# Patient Record
Sex: Female | Born: 1955 | Race: White | Hispanic: No | Marital: Married | Smoking: Never smoker
Health system: Southern US, Community
[De-identification: ages and names within clinical notes are randomized; demographics above are authoritative.]

## PROBLEM LIST (undated history)

## (undated) DIAGNOSIS — C801 Malignant (primary) neoplasm, unspecified: Secondary | ICD-10-CM

## (undated) DIAGNOSIS — N952 Postmenopausal atrophic vaginitis: Secondary | ICD-10-CM

## (undated) DIAGNOSIS — R8290 Unspecified abnormal findings in urine: Secondary | ICD-10-CM

## (undated) DIAGNOSIS — N950 Postmenopausal bleeding: Principal | ICD-10-CM

## (undated) DIAGNOSIS — R519 Headache, unspecified: Secondary | ICD-10-CM

## (undated) DIAGNOSIS — R319 Hematuria, unspecified: Principal | ICD-10-CM

## (undated) DIAGNOSIS — M1990 Unspecified osteoarthritis, unspecified site: Secondary | ICD-10-CM

## (undated) DIAGNOSIS — R51 Headache: Secondary | ICD-10-CM

## (undated) HISTORY — DX: Hematuria, unspecified: R31.9

## (undated) HISTORY — DX: Postmenopausal atrophic vaginitis: N95.2

## (undated) HISTORY — DX: Malignant (primary) neoplasm, unspecified: C80.1

## (undated) HISTORY — PX: OTHER SURGICAL HISTORY: SHX169

## (undated) HISTORY — DX: Unspecified abnormal findings in urine: R82.90

## (undated) HISTORY — DX: Postmenopausal bleeding: N95.0

---

## 2002-10-05 ENCOUNTER — Ambulatory Visit (HOSPITAL_COMMUNITY): Admission: RE | Admit: 2002-10-05 | Discharge: 2002-10-05 | Payer: Self-pay | Admitting: Obstetrics and Gynecology

## 2002-10-05 ENCOUNTER — Encounter: Payer: Self-pay | Admitting: Obstetrics and Gynecology

## 2005-01-22 DIAGNOSIS — C4491 Basal cell carcinoma of skin, unspecified: Secondary | ICD-10-CM

## 2005-01-22 HISTORY — DX: Basal cell carcinoma of skin, unspecified: C44.91

## 2006-06-24 ENCOUNTER — Ambulatory Visit (HOSPITAL_COMMUNITY): Admission: RE | Admit: 2006-06-24 | Discharge: 2006-06-24 | Payer: Self-pay | Admitting: Obstetrics and Gynecology

## 2013-06-30 ENCOUNTER — Ambulatory Visit (INDEPENDENT_AMBULATORY_CARE_PROVIDER_SITE_OTHER): Payer: 59 | Admitting: Adult Health

## 2013-06-30 ENCOUNTER — Encounter: Payer: Self-pay | Admitting: Adult Health

## 2013-06-30 VITALS — BP 120/78 | Ht 68.0 in | Wt 148.0 lb

## 2013-06-30 DIAGNOSIS — N952 Postmenopausal atrophic vaginitis: Secondary | ICD-10-CM

## 2013-06-30 DIAGNOSIS — R82998 Other abnormal findings in urine: Secondary | ICD-10-CM

## 2013-06-30 DIAGNOSIS — R319 Hematuria, unspecified: Secondary | ICD-10-CM

## 2013-06-30 DIAGNOSIS — R8290 Unspecified abnormal findings in urine: Secondary | ICD-10-CM | POA: Insufficient documentation

## 2013-06-30 HISTORY — DX: Postmenopausal atrophic vaginitis: N95.2

## 2013-06-30 HISTORY — DX: Hematuria, unspecified: R31.9

## 2013-06-30 LAB — POCT URINALYSIS DIPSTICK
Glucose, UA: NEGATIVE
LEUKOCYTES UA: NEGATIVE
NITRITE UA: NEGATIVE
PROTEIN UA: NEGATIVE

## 2013-06-30 NOTE — Patient Instructions (Signed)
Hematuria, Adult Hematuria is blood in your urine. It can be caused by a bladder infection, kidney infection, prostate infection, kidney stone, or cancer of your urinary tract. Infections can usually be treated with medicine, and a kidney stone usually will pass through your urine. If neither of these is the cause of your hematuria, further workup to find out the reason may be needed. It is very important that you tell your health care provider about any blood you see in your urine, even if the blood stops without treatment or happens without causing pain. Blood in your urine that happens and then stops and then happens again can be a symptom of a very serious condition. Also, pain is not a symptom in the initial stages of many urinary cancers. HOME CARE INSTRUCTIONS   Drink lots of fluid, 3 4 quarts a day. If you have been diagnosed with an infection, cranberry juice is especially recommended, in addition to large amounts of water.  Avoid caffeine, tea, and carbonated beverages, because they tend to irritate the bladder.  Avoid alcohol because it may irritate the prostate.  Only take over-the-counter or prescription medicines for pain, discomfort, or fever as directed by your health care provider.  If you have been diagnosed with a kidney stone, follow your health care provider's instructions regarding straining your urine to catch the stone.  Empty your bladder often. Avoid holding urine for long periods of time.  After a bowel movement, women should cleanse front to back. Use each tissue only once.  Empty your bladder before and after sexual intercourse if you are a female. SEEK MEDICAL CARE IF: You develop back pain, fever, a feeling of sickness in your stomach (nausea), or vomiting or if your symptoms are not better in 3 days. Return sooner if you are getting worse. SEEK IMMEDIATE MEDICAL CARE IF:   You have a persistent fever, with a temperature of 101.22F (38.8C) or greater.  You  develop severe vomiting and are unable to keep the medicine down.  You develop severe back or abdominal pain despite taking your medicines.  You begin passing a large amount of blood or clots in your urine.  You feel extremely weak or faint, or you pass out. MAKE SURE YOU:   Understand these instructions.  Will watch your condition.  Will get help right away if you are not doing well or get worse. Document Released: 01/08/2005 Document Revised: 10/29/2012 Document Reviewed: 09/08/2012 [facility] Patient Information 2014 [place]. Return in 1 week for pap and physical

## 2013-06-30 NOTE — Progress Notes (Signed)
Subjective:     Patient ID: Amy Kelley, female   DOB: 1956-01-19, 58 y.o.   MRN: [id number]  HPI Amy Kelley is a 58 year old white female, married in complaining of odor in urine x 1 month, no burning or itching has some stress UI at times.Had having sex at present.Has not had pap in over 10 years.  Review of Systems See HPI Reviewed past medical,surgical, social and family history. Reviewed medications and allergies.     Objective:   Physical Exam BP 120/78  Ht 5\' 8"  (1.727 m)  Wt 148 lb (67.132 kg)  BMI 22.51 kg/m2Urine dipstick +blood, Skin warm and dry.Pelvic: external genitalia is normal in appearance for age, has angiokeratoma of fordyce, vagina: has loss of color, moisture and rugae, cervix:smooth and atrophic ? Small polyp noted, uterus: normal size, shape and contour, non tender, no masses felt, adnexa: no masses or tenderness noted.   Discussed that loss of estrogen can affect bladder and vagina, will discuss further at follow up.   Assessment:     Hematuria Bad odor of urine Vaginal atrophy    Plan:    UA C&S sent Review handout on hematuria Push fluids Return in 1 week for pap and physical

## 2013-07-08 ENCOUNTER — Telehealth: Payer: Self-pay | Admitting: Adult Health

## 2013-07-08 ENCOUNTER — Other Ambulatory Visit: Payer: 59 | Admitting: Adult Health

## 2013-07-08 NOTE — Telephone Encounter (Signed)
Left message to call back  

## 2013-07-09 ENCOUNTER — Telehealth: Payer: Self-pay | Admitting: Adult Health

## 2013-07-09 NOTE — Telephone Encounter (Signed)
Has had URI and cancelled appt, told her to get urine when comes back in

## 2013-07-23 ENCOUNTER — Encounter: Payer: Self-pay | Admitting: Adult Health

## 2013-07-23 ENCOUNTER — Ambulatory Visit (INDEPENDENT_AMBULATORY_CARE_PROVIDER_SITE_OTHER): Payer: 59 | Admitting: Adult Health

## 2013-07-23 ENCOUNTER — Other Ambulatory Visit: Payer: Self-pay | Admitting: Adult Health

## 2013-07-23 ENCOUNTER — Other Ambulatory Visit (HOSPITAL_COMMUNITY)
Admission: RE | Admit: 2013-07-23 | Discharge: 2013-07-23 | Disposition: A | Discharge status: Home / Self Care | Payer: 59 | Source: Ambulatory Visit | Attending: Adult Health | Admitting: Adult Health

## 2013-07-23 VITALS — BP 120/78 | HR 72 | Ht 67.5 in | Wt 144.5 lb

## 2013-07-23 DIAGNOSIS — Z1212 Encounter for screening for malignant neoplasm of rectum: Secondary | ICD-10-CM

## 2013-07-23 DIAGNOSIS — Z01419 Encounter for gynecological examination (general) (routine) without abnormal findings: Secondary | ICD-10-CM | POA: Insufficient documentation

## 2013-07-23 DIAGNOSIS — Z139 Encounter for screening, unspecified: Secondary | ICD-10-CM

## 2013-07-23 DIAGNOSIS — N952 Postmenopausal atrophic vaginitis: Secondary | ICD-10-CM

## 2013-07-23 DIAGNOSIS — Z87448 Personal history of other diseases of urinary system: Secondary | ICD-10-CM

## 2013-07-23 DIAGNOSIS — Z1151 Encounter for screening for human papillomavirus (HPV): Secondary | ICD-10-CM | POA: Insufficient documentation

## 2013-07-23 LAB — URINALYSIS
Bilirubin Urine: NEGATIVE
Glucose, UA: NEGATIVE mg/dL
HGB URINE DIPSTICK: NEGATIVE
KETONES UR: NEGATIVE mg/dL
Leukocytes, UA: NEGATIVE
Nitrite: NEGATIVE
Protein, ur: NEGATIVE mg/dL
SPECIFIC GRAVITY, URINE: 1.011 (ref 1.005–1.030)
UROBILINOGEN UA: 0.2 mg/dL (ref 0.0–1.0)
pH: 7 (ref 5.0–8.0)

## 2013-07-23 LAB — HEMOCCULT GUIAC POC 1CARD (OFFICE): Fecal Occult Blood, POC: NEGATIVE

## 2013-07-23 NOTE — Progress Notes (Signed)
Patient ID: Amy Kelley, female   DOB: March 15, 1955, 58 y.o.   MRN: [id number] History of Present Illness: Amy Kelley is a 58 year old white female, married in for a pap and physical.She still notices odor when pees.   Current Medications, Allergies, Past Medical History, Past Surgical History, Family History and Social History were reviewed in [employer] record.     Review of Systems: Patient denies any headaches, blurred vision, shortness of breath, chest pain, abdominal pain, problems with bowel movements, urination, or intercourse. Not currently having sex,no joint pain or mood swings.Has odor when pees, had blood in urine last visit. Reviewed old records from Dr Bynum Bellows last seen 2007.   Physical Exam:BP 120/78  Pulse 72  Ht 5' 7.5" (1.715 m)  Wt 144 lb 8 oz (65.545 kg)  BMI 22.28 kg/m2 General:  Well developed, well nourished, no acute distress Skin:  Warm and dry Neck:  Midline trachea, normal thyroid Lungs; Clear to auscultation bilaterally Breast:  No dominant palpable mass, retraction, or nipple discharge Cardiovascular: Regular rate and rhythm Abdomen:  Soft, non tender, no hepatosplenomegaly Pelvic:  External genitalia is normal in appearance.  The vagina has loss of color,moisture and rugae.    The cervix is atrophic and stenotic at os, pap with HPV performed, no odor noted.  Uterus is felt to be normal size, shape, and contour.  No        adnexal masses or tenderness noted. Rectal: Good sphincter tone, no polyps, or hemorrhoids felt.  Hemoccult negative. Extremities:  No swelling or varicosities noted Psych:  No mood changes, alert and cooperative,seems happy   Impression: Yearly gyn exam History of hematuria Vaginal atrophy    Plan: Physical in 1 year Mammogram now and yearly Refer to Dr Laural Golden for colonoscopy Get fasting labs in near future, CBC,CMP,TSH and lipids Try Luvena first, may try vaginal estrogen if luvena does not help Review  handout on menopause changes Dexa at 59 UA C&S sent

## 2013-07-23 NOTE — Patient Instructions (Signed)
Physical in 1 year Mammogram now and yearly [phone] Refer to Dr Laural Golden  For colonoscopy Try luvena  Get fasting labs in near future  Menopause Menopause is the normal time of life when menstrual periods stop completely. Menopause is complete when you have missed 12 consecutive menstrual periods. It usually occurs between the ages of 64 years and 75 years. Very rarely does a woman develop menopause before the age of 66 years. At menopause, your ovaries stop producing the female hormones estrogen and progesterone. This can cause undesirable symptoms and also affect your health. Sometimes the symptoms may occur 4-5 years before the menopause begins. There is no relationship between menopause and:  Oral contraceptives.  Number of children you had.  Race.  The age your menstrual periods started (menarche). Heavy smokers and very thin women may develop menopause earlier in life. CAUSES  The ovaries stop producing the female hormones estrogen and progesterone.  Other causes include:  Surgery to remove both ovaries.  The ovaries stop functioning for no known reason.  Tumors of the pituitary gland in the brain.  Medical disease that affects the ovaries and hormone production.  Radiation treatment to the abdomen or pelvis.  Chemotherapy that affects the ovaries. SYMPTOMS   Hot flashes.  Night sweats.  Decrease in sex drive.  Vaginal dryness and thinning of the vagina causing painful intercourse.  Dryness of the skin and developing wrinkles.  Headaches.  Tiredness.  Irritability.  Memory problems.  Weight gain.  Bladder infections.  Hair growth of the face and chest.  Infertility. More serious symptoms include:  Loss of bone (osteoporosis) causing breaks (fractures).  Depression.  Hardening and narrowing of the arteries (atherosclerosis) causing heart attacks and strokes. DIAGNOSIS   When the menstrual periods have stopped for 12 straight months.  Physical  exam.  Hormone studies of the blood. TREATMENT  There are many treatment choices and nearly as many questions about them. The decisions to treat or not to treat menopausal changes is an individual choice made with your health care provider. Your health care provider can discuss the treatments with you. Together, you can decide which treatment will work best for you. Your treatment choices may include:   Hormone therapy (estrogen and progesterone).  Non-hormonal medicines.  Treating the individual symptoms with medicine (for example antidepressants for depression).  Herbal medicines that may help specific symptoms.  Counseling by a psychiatrist or psychologist.  Group therapy.  Lifestyle changes including:  Eating healthy.  Regular exercise.  Limiting caffeine and alcohol.  Stress management and meditation.  No treatment. HOME CARE INSTRUCTIONS   Take the medicine your health care provider gives you as directed.  Get plenty of sleep and rest.  Exercise regularly.  Eat a diet that contains calcium (good for the bones) and soy products (acts like estrogen hormone).  Avoid alcoholic beverages.  Do not smoke.  If you have hot flashes, dress in layers.  Take supplements, calcium, and vitamin D to strengthen bones.  You can use over-the-counter lubricants or moisturizers for vaginal dryness.  Group therapy is sometimes very helpful.  Acupuncture may be helpful in some cases. SEEK MEDICAL CARE IF:   You are not sure you are in menopause.  You are having menopausal symptoms and need advice and treatment.  You are still having menstrual periods after age 14 years.  You have pain with intercourse.  Menopause is complete (no menstrual period for 12 months) and you develop vaginal bleeding.  You need a referral  to a specialist (gynecologist, psychiatrist, or psychologist) for treatment. SEEK IMMEDIATE MEDICAL CARE IF:   You have severe depression.  You have  excessive vaginal bleeding.  You fell and think you have a broken bone.  You have pain when you urinate.  You develop leg or chest pain.  You have a fast pounding heart beat (palpitations).  You have severe headaches.  You develop vision problems.  You feel a lump in your breast.  You have abdominal pain or severe indigestion. Document Released: 03/31/2003 Document Revised: 09/10/2012 Document Reviewed: 08/07/2012 [facility] Patient Information 2015 [place], [place]. This information is not intended to replace advice given to you by your health care provider. Make sure you discuss any questions you have with your health care provider.

## 2013-07-26 LAB — URINE CULTURE: Colony Count: 75000

## 2013-07-27 ENCOUNTER — Other Ambulatory Visit: Payer: 59

## 2013-07-27 ENCOUNTER — Telehealth: Payer: Self-pay | Admitting: Adult Health

## 2013-07-27 DIAGNOSIS — Z0000 Encounter for general adult medical examination without abnormal findings: Secondary | ICD-10-CM

## 2013-07-27 DIAGNOSIS — Z13220 Encounter for screening for lipoid disorders: Secondary | ICD-10-CM

## 2013-07-27 DIAGNOSIS — Z1329 Encounter for screening for other suspected endocrine disorder: Secondary | ICD-10-CM

## 2013-07-27 LAB — CBC
HCT: 39.7 % (ref 36.0–46.0)
HEMOGLOBIN: 13.8 g/dL (ref 12.0–15.0)
MCH: 29.2 pg (ref 26.0–34.0)
MCHC: 34.8 g/dL (ref 30.0–36.0)
MCV: 83.9 fL (ref 78.0–100.0)
Platelets: 223 10*3/uL (ref 150–400)
RBC: 4.73 MIL/uL (ref 3.87–5.11)
RDW: 13.3 % (ref 11.5–15.5)
WBC: 4.6 10*3/uL (ref 4.0–10.5)

## 2013-07-27 MED ORDER — SULFAMETHOXAZOLE-TMP DS 800-160 MG PO TABS: 1.0000 | ORAL_TABLET | Freq: Two times a day (BID) | ORAL | Status: DC

## 2013-07-27 NOTE — Telephone Encounter (Signed)
Rx septra ds 1 bid x 7 days for UTI +ecoli

## 2013-07-28 ENCOUNTER — Encounter (INDEPENDENT_AMBULATORY_CARE_PROVIDER_SITE_OTHER): Payer: Self-pay | Admitting: *Deleted

## 2013-07-28 LAB — COMPREHENSIVE METABOLIC PANEL
ALK PHOS: 77 U/L (ref 39–117)
ALT: 16 U/L (ref 0–35)
AST: 24 U/L (ref 0–37)
Albumin: 4.4 g/dL (ref 3.5–5.2)
BILIRUBIN TOTAL: 0.4 mg/dL (ref 0.2–1.2)
BUN: 11 mg/dL (ref 6–23)
CO2: 30 meq/L (ref 19–32)
Calcium: 8.9 mg/dL (ref 8.4–10.5)
Chloride: 103 mEq/L (ref 96–112)
Creat: 0.6 mg/dL (ref 0.50–1.10)
Glucose, Bld: 83 mg/dL (ref 70–99)
Potassium: 4.4 mEq/L (ref 3.5–5.3)
SODIUM: 141 meq/L (ref 135–145)
TOTAL PROTEIN: 6.6 g/dL (ref 6.0–8.3)

## 2013-07-28 LAB — LIPID PANEL
Cholesterol: 202 mg/dL — ABNORMAL HIGH (ref 0–200)
HDL: 61 mg/dL (ref >39)
LDL CALC: 125 mg/dL — AB (ref 0–99)
Total CHOL/HDL Ratio: 3.3 Ratio
Triglycerides: 82 mg/dL (ref <150)
VLDL: 16 mg/dL (ref 0–40)

## 2013-07-28 LAB — CYTOLOGY - PAP

## 2013-07-28 LAB — TSH: TSH: 1.245 u[IU]/mL (ref 0.350–4.500)

## 2013-07-29 ENCOUNTER — Telehealth: Payer: Self-pay | Admitting: Adult Health

## 2013-07-29 NOTE — Telephone Encounter (Signed)
Pt aware of labs and pap, will mail labs to her

## 2013-07-29 NOTE — Telephone Encounter (Signed)
Left message labs back and look good and pap normal, call me back

## 2013-07-30 ENCOUNTER — Ambulatory Visit (HOSPITAL_COMMUNITY)
Admission: RE | Admit: 2013-07-30 | Discharge: 2013-07-30 | Disposition: A | Discharge status: Home / Self Care | Payer: 59 | Source: Ambulatory Visit | Attending: Adult Health | Admitting: Adult Health

## 2013-07-30 DIAGNOSIS — Z139 Encounter for screening, unspecified: Secondary | ICD-10-CM

## 2013-07-30 DIAGNOSIS — Z1231 Encounter for screening mammogram for malignant neoplasm of breast: Secondary | ICD-10-CM | POA: Insufficient documentation

## 2013-07-31 ENCOUNTER — Encounter (INDEPENDENT_AMBULATORY_CARE_PROVIDER_SITE_OTHER): Payer: Self-pay | Admitting: *Deleted

## 2013-07-31 ENCOUNTER — Other Ambulatory Visit (INDEPENDENT_AMBULATORY_CARE_PROVIDER_SITE_OTHER): Payer: Self-pay | Admitting: *Deleted

## 2013-07-31 DIAGNOSIS — Z1211 Encounter for screening for malignant neoplasm of colon: Secondary | ICD-10-CM

## 2013-08-24 ENCOUNTER — Telehealth (INDEPENDENT_AMBULATORY_CARE_PROVIDER_SITE_OTHER): Payer: Self-pay | Admitting: *Deleted

## 2013-08-24 DIAGNOSIS — Z1211 Encounter for screening for malignant neoplasm of colon: Secondary | ICD-10-CM

## 2013-08-24 NOTE — Telephone Encounter (Signed)
Patient needs movi prep 

## 2013-08-26 MED ORDER — PEG-KCL-NACL-NASULF-NA ASC-C 100 G PO SOLR: 1.0000 | Freq: Once | ORAL | Status: DC

## 2013-09-18 ENCOUNTER — Telehealth (INDEPENDENT_AMBULATORY_CARE_PROVIDER_SITE_OTHER): Payer: Self-pay | Admitting: *Deleted

## 2013-09-18 NOTE — Telephone Encounter (Signed)
agree

## 2013-09-18 NOTE — Telephone Encounter (Signed)
  Procedure: tcs  Reason/Indication:  screening  Has patient had this procedure before?  no  If so, when, by whom and where?    Is there a family history of colon cancer?  no  Who?  What age when diagnosed?    Is patient diabetic?   no      Does patient have prosthetic heart valve?  no  Do you have a pacemaker?  no  Has patient ever had endocarditis? no  Has patient had joint replacement within last 12 months?  no  Does patient tend to be constipated or take laxatives? no  Is patient on Coumadin, Plavix and/or Aspirin? no  Medications: one a day vitamin, fish oil  Allergies: nkda  Medication Adjustment:   Procedure date & time: 10/15/13 at 930

## 2013-09-29 ENCOUNTER — Encounter (HOSPITAL_COMMUNITY): Payer: Self-pay | Admitting: Pharmacy Technician

## 2013-10-15 ENCOUNTER — Ambulatory Visit (HOSPITAL_COMMUNITY)
Admission: RE | Admit: 2013-10-15 | Discharge: 2013-10-15 | Disposition: A | Discharge status: Home / Self Care | Payer: 59 | Source: Ambulatory Visit | Attending: Internal Medicine | Admitting: Internal Medicine

## 2013-10-15 ENCOUNTER — Encounter (HOSPITAL_COMMUNITY)
Admission: RE | Disposition: A | Discharge status: Home / Self Care | Payer: Self-pay | Source: Ambulatory Visit | Attending: Internal Medicine

## 2013-10-15 ENCOUNTER — Encounter (HOSPITAL_COMMUNITY): Payer: Self-pay | Admitting: *Deleted

## 2013-10-15 DIAGNOSIS — Z85828 Personal history of other malignant neoplasm of skin: Secondary | ICD-10-CM | POA: Insufficient documentation

## 2013-10-15 DIAGNOSIS — Q438 Other specified congenital malformations of intestine: Secondary | ICD-10-CM | POA: Insufficient documentation

## 2013-10-15 DIAGNOSIS — Z1211 Encounter for screening for malignant neoplasm of colon: Secondary | ICD-10-CM

## 2013-10-15 DIAGNOSIS — K644 Residual hemorrhoidal skin tags: Secondary | ICD-10-CM | POA: Insufficient documentation

## 2013-10-15 HISTORY — PX: COLONOSCOPY: SHX5424

## 2013-10-15 SURGERY — COLONOSCOPY
Anesthesia: Moderate Sedation

## 2013-10-15 MED ORDER — MEPERIDINE HCL 50 MG/ML IJ SOLN
INTRAMUSCULAR | Status: AC
  Filled 2013-10-15: qty 1

## 2013-10-15 MED ORDER — STERILE WATER FOR IRRIGATION IR SOLN
Status: DC | PRN
  Administered 2013-10-15: 09:00:00

## 2013-10-15 MED ORDER — MIDAZOLAM HCL 5 MG/5ML IJ SOLN
INTRAMUSCULAR | Status: DC | PRN
  Administered 2013-10-15: 2 mg via INTRAVENOUS
  Administered 2013-10-15 (×3): 1 mg via INTRAVENOUS
  Administered 2013-10-15: 2 mg via INTRAVENOUS

## 2013-10-15 MED ORDER — MIDAZOLAM HCL 5 MG/5ML IJ SOLN
INTRAMUSCULAR | Status: AC
  Filled 2013-10-15: qty 10

## 2013-10-15 MED ORDER — SODIUM CHLORIDE 0.9 % IV SOLN
INTRAVENOUS | Status: DC
  Administered 2013-10-15: 09:00:00 via INTRAVENOUS

## 2013-10-15 MED ORDER — MEPERIDINE HCL 50 MG/ML IJ SOLN
INTRAMUSCULAR | Status: DC | PRN
  Administered 2013-10-15 (×2): 25 mg via INTRAVENOUS

## 2013-10-15 NOTE — Discharge Instructions (Signed)
Resume usual medications and diet. °No driving for 24 hours. °Next screening exam in 10 years. ° ° °Colonoscopy, Care After °Refer to this sheet in the next few weeks. These instructions provide you with information on caring for yourself after your procedure. Your health care provider may also give you more specific instructions. Your treatment has been planned according to current medical practices, but problems sometimes occur. Call your health care provider if you have any problems or questions after your procedure. °WHAT TO EXPECT AFTER THE PROCEDURE  °After your procedure, it is typical to have the following: °· A small amount of blood in your stool. °· Moderate amounts of gas and mild abdominal cramping or bloating. °HOME CARE INSTRUCTIONS °· Do not drive, operate machinery, or sign important documents for 24 hours. °· You may shower and resume your regular physical activities, but move at a slower pace for the first 24 hours. °· Take frequent rest periods for the first 24 hours. °· Walk around or put a warm pack on your abdomen to help reduce abdominal cramping and bloating. °· Drink enough fluids to keep your urine clear or pale yellow. °· You may resume your normal diet as instructed by your health care provider. Avoid heavy or fried foods that are hard to digest. °· Avoid drinking alcohol for 24 hours or as instructed by your health care provider. °· Only take over-the-counter or prescription medicines as directed by your health care provider. °· If a tissue sample (biopsy) was taken during your procedure: °¨ Do not take aspirin or blood thinners for 7 days, or as instructed by your health care provider. °¨ Do not drink alcohol for 7 days, or as instructed by your health care provider. °¨ Eat soft foods for the first 24 hours. °SEEK MEDICAL CARE IF: °You have persistent spotting of blood in your stool 2-3 days after the procedure. °SEEK IMMEDIATE MEDICAL CARE IF: °· You have more than a small spotting of  blood in your stool. °· You pass large blood clots in your stool. °· Your abdomen is swollen (distended). °· You have nausea or vomiting. °· You have a fever. °· You have increasing abdominal pain that is not relieved with medicine. °Document Released: 08/23/2003 Document Revised: 10/29/2012 Document Reviewed: 09/15/2012 °ExitCare® Patient Information ©2015 [employer]. This information is not intended to replace advice given to you by your health care provider. Make sure you discuss any questions you have with your health care provider. ° °

## 2013-10-15 NOTE — H&P (Signed)
Amy Kelley is an 58 y.o. female.   Chief Complaint: Patient is here for colonoscopy. HPI: Patient is 58 year old Caucasian female who is here for screening colonoscopy. She denies abdominal pain change in her bowel habits or frank rectal bleeding. She has intermittent hematochezia felt to be secondary to hemorrhoids. Family history is negative for CRC.  Past Medical History  Diagnosis Date  . Cancer     skin cancer   . Hematuria 06/30/2013  . Bad odor of urine 06/30/2013  . Vaginal atrophy 06/30/2013    Past Surgical History  Procedure Laterality Date  . Skin cancer removal      on left leg about 5 years ago or more.  . Cesarean section      Family History  Problem Relation Age of Onset  . Alzheimer's disease Father   . Cancer Sister     ovarian   . Epilepsy Sister   . Heart attack Maternal Grandfather   . Osteoporosis Mother    Social History:  reports that she has never smoked. She has never used smokeless tobacco. She reports that she does not drink alcohol or use illicit drugs.  Allergies: No Known Allergies  Medications Prior to Admission  Medication Sig Dispense Refill  . Multiple Vitamin (MULTIVITAMIN) tablet Take 1 tablet by mouth daily.      . Omega-3 Fatty Acids (FISH OIL PEARLS PO) Take 1 capsule by mouth daily.      . peg 3350 powder (MOVIPREP) 100 G SOLR Take 1 kit (200 g total) by mouth once.  1 kit  0    No results found for this or any previous visit (from the past 48 hour(s)). No results found.  ROS  Blood pressure 127/59, pulse 89, temperature 97.8 F (36.6 C), temperature source Oral, resp. rate 20, SpO2 98.00%. Physical Exam  Constitutional: She appears well-developed and well-nourished.  HENT:  Mouth/Throat: Oropharynx is clear and moist.  Eyes: Conjunctivae are normal. No scleral icterus.  Neck: No thyromegaly present.  GI: Soft. She exhibits no distension and no mass. There is no tenderness.  Easily palpable aorta; it is not expanded   Musculoskeletal: She exhibits no edema.  Lymphadenopathy:    She has no cervical adenopathy.  Neurological: She is alert.  Skin: Skin is warm and dry.     Assessment/Plan Average risk screening colonoscopy.  , U 10/15/2013, 9:26 AM

## 2013-10-16 ENCOUNTER — Encounter (HOSPITAL_COMMUNITY): Payer: Self-pay | Admitting: Internal Medicine

## 2013-10-28 NOTE — Op Note (Signed)
[facility] [address] [place], [address]   COLONOSCOPY PROCEDURE REPORT  PATIENT: Amy Kelley, Amy Kelley  MR#: [id number] BIRTHDATE: 01/30/1958 , 72  yrs. old GENDER: female ENDOSCOPIST: Hildred Laser, MD REFERRED BY:  Derrek Monaco PROCEDURE DATE:  10/15/2013 PROCEDURE:   Colonoscopy, screening ASA CLASS:   Class I INDICATIONS:average risk for colon cancer. MEDICATIONS: Cetacaine spray for oral pharyngeal topical anesthesia, Meperidine (Demerol) 50 mg IV, and Versed 7 mg IV  DESCRIPTION OF PROCEDURE:   After the risks and benefits and of the procedure were explained, informed consent was obtained.  revealed a normal rectal and perianal exam.    The EC-[id number] ([id number]) endoscope was introduced through the anus and advanced to the cecum, which was identified by both the appendix and ileocecal valve .  The quality of the prep was excellent. .  The instrument was then slowly withdrawn as the colon was fully examined.     COLON FINDINGS: A normal appearing cecum, ileocecal valve, and appendiceal orifice were identified.  The ascending, transverse, descending, sigmoid colon, and rectum appeared unremarkable.   The colon was redundant.  Manual abdominal counter-pressure was used to reach the cecum.     Retroflexed views revealed external hemorrhoids.     The scope was then withdrawn from the patient and the procedure completed.  WITHDRAWAL TIME: 8 minutes 33 seconds  COMPLICATIONS: There were no complications. ENDOSCOPIC IMPRESSION: 1.   external hemorrhoids otherwise normal colonoscopy 2.   The colon was redundant RECOMMENDATIONS: Standard instructions given. next screening exam in 10 years.  REPEAT EXAM:  cc:  _______________________________ Lorrin MaisHildred Laser, MD 10/15/2013 10:45 AM     PATIENT NAME:  Amy Kelley, Amy Kelley MR#: [id number]

## 2013-11-23 ENCOUNTER — Encounter (HOSPITAL_COMMUNITY): Payer: Self-pay | Admitting: Internal Medicine

## 2015-04-28 ENCOUNTER — Telehealth: Payer: Self-pay | Admitting: Adult Health

## 2015-04-28 NOTE — Telephone Encounter (Signed)
She has sister has ovarian cancer and pancreatic cancer.she has wiped some pink, will make appt for next week for exam and will then get [place] to assess

## 2015-05-02 ENCOUNTER — Encounter: Payer: Self-pay | Admitting: Adult Health

## 2015-05-02 ENCOUNTER — Ambulatory Visit (INDEPENDENT_AMBULATORY_CARE_PROVIDER_SITE_OTHER): Payer: BLUE CROSS/BLUE SHIELD | Admitting: Adult Health

## 2015-05-02 VITALS — BP 120/82 | HR 76 | Ht 68.0 in | Wt 146.0 lb

## 2015-05-02 DIAGNOSIS — N950 Postmenopausal bleeding: Secondary | ICD-10-CM | POA: Diagnosis not present

## 2015-05-02 HISTORY — DX: Postmenopausal bleeding: N95.0

## 2015-05-02 NOTE — Patient Instructions (Signed)
Postmenopausal Bleeding Postmenopausal bleeding is any bleeding a woman has after she has entered into menopause. Menopause is the end of a woman's fertile years. After menopause, a woman no longer ovulates or has menstrual periods.  Postmenopausal bleeding can be caused by various things. Any type of postmenopausal bleeding, even if it appears to be a typical menstrual period, is concerning. This should be evaluated by your health care provider. Any treatment will depend on the cause of the bleeding. HOME CARE INSTRUCTIONS Monitor your condition for any changes. The following actions may help to alleviate any discomfort you are experiencing:  Avoid the use of tampons and douches as directed by your health care provider.  Change your pads frequently.  Get regular pelvic exams and Pap tests.  Keep all follow-up appointments for diagnostic tests as directed by your health care provider. SEEK MEDICAL CARE IF:   Your bleeding lasts more than 1 week.  You have abdominal pain.  You have bleeding with sexual intercourse. SEEK IMMEDIATE MEDICAL CARE IF:   You have a fever, chills, headache, dizziness, muscle aches, and bleeding.  You have severe pain with bleeding.  You are passing blood clots.  You have bleeding and need more than 1 pad an hour.  You feel faint. MAKE SURE YOU:  Understand these instructions.  Will watch your condition.  Will get help right away if you are not doing well or get worse.   This information is not intended to replace advice given to you by your health care provider. Make sure you discuss any questions you have with your health care provider.   Document Released: 04/18/2005 Document Revised: 10/29/2012 Document Reviewed: 08/07/2012 Elsevier Interactive Patient Education [employer]. Return in 1 day For [place]

## 2015-05-02 NOTE — Progress Notes (Signed)
Subjective:     Patient ID: ALUEL BUIST, female   DOB: [birth year], 60 y.o.   MRN: [id number]  HPI Lorree is a 60 year old white female, in having had some pink blood on tissue with wiping, no pain.No other complaints, she is postmenopausal.She is not sexually active at present.  Review of Systems  +spotting when wipes in PM female, all other systems negative Reviewed past medical,surgical, social and family history. Reviewed medications and allergies.     Objective:   Physical Exam BP 120/82 mmHg  Pulse 76  Ht 5\' 8"  (1.727 m)  Wt 146 lb (66.225 kg)  BMI 22.20 kg/m2 Skin warm and dry.Pelvic: external genitalia is normal in appearance no lesions, vagina:atrophic,urethra has no lesions or masses noted, cervix:atrophic, uterus: normal size, shape and contour, non tender, no masses felt, adnexa: no masses or tenderness noted. Bladder is non tender and no masses felt.    Discussed needed [place] to assess endometrial lining to r/o polyp or increased thickness,that could be a cancer, will talk when [place] results back. Face time 15 minutes with 50% counseling.  Assessment:     PMB    Plan:     Return in 1 day for gyn [place] Review handout on PMB

## 2015-05-03 ENCOUNTER — Ambulatory Visit (INDEPENDENT_AMBULATORY_CARE_PROVIDER_SITE_OTHER): Payer: BLUE CROSS/BLUE SHIELD

## 2015-05-03 DIAGNOSIS — N888 Other specified noninflammatory disorders of cervix uteri: Secondary | ICD-10-CM

## 2015-05-03 DIAGNOSIS — N950 Postmenopausal bleeding: Secondary | ICD-10-CM

## 2015-05-03 DIAGNOSIS — N854 Malposition of uterus: Secondary | ICD-10-CM | POA: Diagnosis not present

## 2015-05-03 NOTE — Progress Notes (Signed)
PELVIC US TA/TV: homogenous anteverted uterus,EEC 2.8 mm,normal ov's bilat (mobile),no free fluid,mult small nabothian cysts

## 2015-05-05 ENCOUNTER — Telehealth: Payer: Self-pay | Admitting: Adult Health

## 2015-05-05 NOTE — Telephone Encounter (Signed)
left message that [place] was normal

## 2015-05-12 ENCOUNTER — Telehealth: Payer: Self-pay | Admitting: Family Medicine

## 2015-05-12 DIAGNOSIS — Z139 Encounter for screening, unspecified: Secondary | ICD-10-CM

## 2015-05-12 NOTE — Telephone Encounter (Signed)
Pt is requesting lab orders to be sent over for an upcoming appt. Last labs per epic were ordered by [facility]. Pt has not been seen here in a good while. Pt's husband is a current pt here.

## 2015-05-12 NOTE — Telephone Encounter (Signed)
Patient called back, I notified her that orders were in system.

## 2015-05-12 NOTE — Telephone Encounter (Signed)
[facility] 05/12/15( labs ordered)

## 2015-05-12 NOTE — Telephone Encounter (Signed)
Lip liv m7 

## 2015-05-17 LAB — HEPATIC FUNCTION PANEL
ALT: 15 IU/L (ref 0–32)
AST: 26 IU/L (ref 0–40)
Albumin: 4.4 g/dL (ref 3.6–4.8)
Alkaline Phosphatase: 106 IU/L (ref 39–117)
Bilirubin Total: 0.4 mg/dL (ref 0.0–1.2)
Bilirubin, Direct: 0.07 mg/dL (ref 0.00–0.40)
Total Protein: 7 g/dL (ref 6.0–8.5)

## 2015-05-17 LAB — BASIC METABOLIC PANEL
BUN/Creatinine Ratio: 18 (ref 12–28)
BUN: 13 mg/dL (ref 8–27)
CO2: 26 mmol/L (ref 18–29)
Calcium: 9.9 mg/dL (ref 8.7–10.3)
Chloride: 102 mmol/L (ref 96–106)
Creatinine, Ser: 0.73 mg/dL (ref 0.57–1.00)
GFR, EST AFRICAN AMERICAN: 104 mL/min/{1.73_m2} (ref >59)
GFR, EST NON AFRICAN AMERICAN: 90 mL/min/{1.73_m2} (ref >59)
Glucose: 88 mg/dL (ref 65–99)
POTASSIUM: 5.5 mmol/L — AB (ref 3.5–5.2)
SODIUM: 141 mmol/L (ref 134–144)

## 2015-05-17 LAB — LIPID PANEL
Chol/HDL Ratio: 3.3 ratio units (ref 0.0–4.4)
Cholesterol, Total: 237 mg/dL — ABNORMAL HIGH (ref 100–199)
HDL: 71 mg/dL (ref >39)
LDL Calculated: 145 mg/dL — ABNORMAL HIGH (ref 0–99)
TRIGLYCERIDES: 104 mg/dL (ref 0–149)
VLDL Cholesterol Cal: 21 mg/dL (ref 5–40)

## 2015-05-26 ENCOUNTER — Encounter: Payer: Self-pay | Admitting: Family Medicine

## 2015-05-26 ENCOUNTER — Ambulatory Visit (INDEPENDENT_AMBULATORY_CARE_PROVIDER_SITE_OTHER): Payer: BLUE CROSS/BLUE SHIELD | Admitting: Family Medicine

## 2015-05-26 ENCOUNTER — Telehealth: Payer: Self-pay | Admitting: Family Medicine

## 2015-05-26 VITALS — BP 118/82 | Ht 68.0 in | Wt 142.2 lb

## 2015-05-26 DIAGNOSIS — Z780 Asymptomatic menopausal state: Secondary | ICD-10-CM

## 2015-05-26 DIAGNOSIS — Z0000 Encounter for general adult medical examination without abnormal findings: Secondary | ICD-10-CM | POA: Diagnosis not present

## 2015-05-26 DIAGNOSIS — Z0189 Encounter for other specified special examinations: Secondary | ICD-10-CM

## 2015-05-26 DIAGNOSIS — Z13820 Encounter for screening for osteoporosis: Secondary | ICD-10-CM | POA: Diagnosis not present

## 2015-05-26 NOTE — Patient Instructions (Addendum)
Daily vitamin D plus calcium supplement, two tab daily every day 600mg  calcium plus 400 miu of vit d per tablet,  Recommend going back on centrum silver  Recommend zoster vaccine to prevent shingles    Results for orders placed or performed in visit on [id number]  Basic metabolic panel  Result Value Ref Range   Glucose 88 65 - 99 mg/dL   BUN 13 8 - 27 mg/dL   Creatinine, Ser 0.73 0.57 - 1.00 mg/dL   GFR calc non Af Amer 90 >59 mL/min/1.73   GFR calc Af Amer 104 >59 mL/min/1.73   BUN/Creatinine Ratio 18 12 - 28   Sodium 141 134 - 144 mmol/L   Potassium 5.5 (H) 3.5 - 5.2 mmol/L   Chloride 102 96 - 106 mmol/L   CO2 26 18 - 29 mmol/L   Calcium 9.9 8.7 - 10.3 mg/dL  Hepatic function panel  Result Value Ref Range   Total Protein 7.0 6.0 - 8.5 g/dL   Albumin 4.4 3.6 - 4.8 g/dL   Bilirubin Total 0.4 0.0 - 1.2 mg/dL   Bilirubin, Direct 0.07 0.00 - 0.40 mg/dL   Alkaline Phosphatase 106 39 - 117 IU/L   AST 26 0 - 40 IU/L   ALT 15 0 - 32 IU/L  Lipid panel  Result Value Ref Range   Cholesterol, Total 237 (H) 100 - 199 mg/dL   Triglycerides 104 0 - 149 mg/dL   HDL 71 >39 mg/dL   VLDL Cholesterol Cal 21 5 - 40 mg/dL   LDL Calculated 145 (H) 0 - 99 mg/dL   Chol/HDL Ratio 3.3 0.0 - 4.4 ratio units   High Cholesterol High cholesterol refers to having a high level of cholesterol in your blood. Cholesterol is a white, waxy, fat-like protein that your body needs in small amounts. Your liver makes all the cholesterol you need. Excess cholesterol comes from the food you eat. Cholesterol travels in your bloodstream through your blood vessels. If you have high cholesterol, deposits (plaque) may build up on the walls of your blood vessels. This makes the arteries narrower and stiffer. Plaque increases your risk of heart attack and stroke. Work with your health care provider to keep your cholesterol levels in a healthy range. RISK FACTORS Several things can make you more likely to have high  cholesterol. These include:   Eating foods high in animal fat (saturated fat) or cholesterol.  Being overweight.  Not getting enough exercise.  Having a family history of high cholesterol. SIGNS AND SYMPTOMS High cholesterol does not cause symptoms. DIAGNOSIS  Your health care provider can do a blood test to check whether you have high cholesterol. If you are older than 20, your health care provider may check your cholesterol every 4-6 years. You may be checked more often if you already have high cholesterol or other risk factors for heart disease. The blood test for cholesterol measures the following:  Bad cholesterol (LDL cholesterol). This is the type of cholesterol that causes heart disease. This number should be less than 100.  Good cholesterol (HDL cholesterol). This type helps protect against heart disease. A healthy level of HDL cholesterol is 60 or higher.  Total cholesterol. This is the combined number of LDL cholesterol and HDL cholesterol. A healthy number is less than 200. TREATMENT  High cholesterol can be treated with diet changes, lifestyle changes, and medicine.   Diet changes may include eating more whole grains, fruits, vegetables, nuts, and fish. You may also have to cut  back on red meat and foods with a lot of added sugar.  Lifestyle changes may include getting at least 40 minutes of aerobic exercise three times a week. Aerobic exercises include walking, biking, and swimming. Aerobic exercise along with a healthy diet can help you maintain a healthy weight. Lifestyle changes may also include quitting smoking.  If diet and lifestyle changes are not enough to lower your cholesterol, your health care provider may prescribe a statin medicine. This medicine has been shown to lower cholesterol and also lower the risk of heart disease. HOME CARE INSTRUCTIONS  Only take over-the-counter or prescription medicines as directed by your health care provider.   Follow a healthy  diet as directed by your health care provider. For instance:   Eat chicken (without skin), fish, veal, shellfish, ground [place] breast, and round or loin cuts of red meat.  Do not eat fried foods and fatty meats, such as hot dogs and salami.   Eat plenty of fruits, such as apples.   Eat plenty of vegetables, such as broccoli, potatoes, and carrots.   Eat beans, peas, and lentils.   Eat grains, such as barley, rice, couscous, and bulgur wheat.   Eat pasta without cream sauces.   Use skim or nonfat milk and low-fat or nonfat yogurt and cheeses. Do not eat or drink whole milk, cream, ice cream, egg yolks, and hard cheeses.   Do not eat stick margarine or tub margarines that contain trans fats (also called partially hydrogenated oils).   Do not eat cakes, cookies, crackers, or other baked goods that contain trans fats.   Do not eat saturated tropical oils, such as coconut and palm oil.   Exercise as directed by your health care provider. Increase your activity level with activities such as gardening or walking.   Keep all follow-up appointments.  SEEK MEDICAL CARE IF:  You are struggling to maintain a healthy diet or weight.  You need help starting an exercise program.  You need help to stop smoking. SEEK IMMEDIATE MEDICAL CARE IF:  You have chest pain.  You have trouble breathing.   This information is not intended to replace advice given to you by your health care provider. Make sure you discuss any questions you have with your health care provider.   Document Released: 01/08/2005 Document Revised: 01/29/2014 Document Reviewed: 10/31/2012 Elsevier Interactive Patient Education [employer].

## 2015-05-26 NOTE — Telephone Encounter (Signed)
Spoke with patient and informed her that Dr.Steve Luking recommends [facility] in [place]. Informed patient of telephone number and address. Patient verbalized understanding.

## 2015-05-26 NOTE — Telephone Encounter (Signed)
Pt called, states she was supposed to get a list of dermatology offices in [place] but didn't Also states that Dr. Richardson Landry highly recommended on office but patient wasn't given a name of the office (I tried to name some offices, but pt wants the office that Dr. Richardson Landry mentioned specifically)   Please advise

## 2015-05-26 NOTE — Progress Notes (Signed)
Subjective:    Patient ID: Amy Kelley, female    DOB: 09-13-1955, 60 y.o.   MRN: [id number]  HPI The patient comes in today for a wellness visit.  utd on colonoscopy, just done two yrs ago, given a ten yr pass  Oldest sister had pancreatic ca dxed in sept with spread to the liver  She is now on chemo, and on a trial drug Pt not exercising at all until just joined [employer] [facility]  A review of their health history was completed.  A review of medications was also completed.  Any needed refills; none  Eating habits: eats pretty good  Falls/  MVA accidents in past few months: none  Regular exercise: outside inside work-gardening  Specialist pt sees on regular basis: see GYN for female concerns  Preventative health issues were discussed.   Additional concerns: what supplements or vitamins are good/safe to take. Pt was on centru miultivit      Results for orders placed or performed in visit on [id number]  Basic metabolic panel  Result Value Ref Range   Glucose 88 65 - 99 mg/dL   BUN 13 8 - 27 mg/dL   Creatinine, Ser 0.73 0.57 - 1.00 mg/dL   GFR calc non Af Amer 90 >59 mL/min/1.73   GFR calc Af Amer 104 >59 mL/min/1.73   BUN/Creatinine Ratio 18 12 - 28   Sodium 141 134 - 144 mmol/L   Potassium 5.5 (H) 3.5 - 5.2 mmol/L   Chloride 102 96 - 106 mmol/L   CO2 26 18 - 29 mmol/L   Calcium 9.9 8.7 - 10.3 mg/dL  Hepatic function panel  Result Value Ref Range   Total Protein 7.0 6.0 - 8.5 g/dL   Albumin 4.4 3.6 - 4.8 g/dL   Bilirubin Total 0.4 0.0 - 1.2 mg/dL   Bilirubin, Direct 0.07 0.00 - 0.40 mg/dL   Alkaline Phosphatase 106 39 - 117 IU/L   AST 26 0 - 40 IU/L   ALT 15 0 - 32 IU/L  Lipid panel  Result Value Ref Range   Cholesterol, Total 237 (H) 100 - 199 mg/dL   Triglycerides 104 0 - 149 mg/dL   HDL 71 >39 mg/dL   VLDL Cholesterol Cal 21 5 - 40 mg/dL   LDL Calculated 145 (H) 0 - 99 mg/dL   Chol/HDL Ratio 3.3 0.0 -  4.4 ratio units    Review of Systems  Constitutional: Negative for activity change, appetite change and fatigue.  HENT: Negative for congestion, ear discharge and rhinorrhea.   Eyes: Negative for discharge.  Respiratory: Negative for cough, chest tightness and wheezing.   Cardiovascular: Negative for chest pain.  Gastrointestinal: Negative for vomiting and abdominal pain.  Genitourinary: Negative for frequency and difficulty urinating.  Musculoskeletal: Negative for neck pain.  Allergic/Immunologic: Negative for environmental allergies and food allergies.  Neurological: Negative for weakness and headaches.  Psychiatric/Behavioral: Negative for behavioral problems and agitation.  All other systems reviewed and are negative.      Objective:   Physical Exam  Constitutional: She is oriented to person, place, and time. She appears well-developed and well-nourished.  HENT:  Head: Normocephalic.  Right Ear: External ear normal.  Left Ear: External ear normal.  Eyes: Pupils are equal, round, and reactive to light.  Neck: Normal range of motion. No thyromegaly present.  Cardiovascular: Normal rate, regular rhythm, normal heart sounds and intact distal pulses.   No murmur heard. Pulmonary/Chest:  Effort normal and breath sounds normal. No respiratory distress. She has no wheezes.  Abdominal: Soft. Bowel sounds are normal. She exhibits no distension and no mass. There is no tenderness.  Musculoskeletal: Normal range of motion. She exhibits no edema or tenderness.  Lymphadenopathy:    She has no cervical adenopathy.  Neurological: She is alert and oriented to person, place, and time. She exhibits normal muscle tone.  Skin: Skin is warm and dry.  Psychiatric: She has a normal mood and affect. Her behavior is normal.  Vitals reviewed.         Assessment & Plan:  Impression wellness exam. Multiple general concerns discussed at great length plan with family history of osteoporosis we'll  do a DEXA scan. This summer it will have been 2 years since mammogram strongly encourage patient go ahead and schedule. Up-to-date on colonoscopy. Shingles vaccine recommended prescription given vitamin D plus calcium 2 tablets daily. Yearly flu shots encourage. Diet exercise discussed at length WSL

## 2015-05-26 NOTE — Telephone Encounter (Signed)
[facility], geve number, may need to ask brendale

## 2015-06-02 ENCOUNTER — Ambulatory Visit (HOSPITAL_COMMUNITY)
Admission: RE | Admit: 2015-06-02 | Discharge: 2015-06-02 | Disposition: A | Discharge status: Home / Self Care | Payer: BLUE CROSS/BLUE SHIELD | Source: Ambulatory Visit | Attending: Family Medicine | Admitting: Family Medicine

## 2015-06-02 DIAGNOSIS — Z780 Asymptomatic menopausal state: Secondary | ICD-10-CM | POA: Diagnosis present

## 2015-06-02 DIAGNOSIS — Z13820 Encounter for screening for osteoporosis: Secondary | ICD-10-CM | POA: Diagnosis present

## 2015-06-02 DIAGNOSIS — M8589 Other specified disorders of bone density and structure, multiple sites: Secondary | ICD-10-CM | POA: Insufficient documentation

## 2015-07-04 ENCOUNTER — Telehealth: Payer: Self-pay | Admitting: Family Medicine

## 2015-07-04 NOTE — Telephone Encounter (Signed)
Patient called in reference to a bill that she received for her bone density scan she had completed on 06/02/2015.  Her insurance did not cover this at 100%, but she spoke with [facility] and they advised her if the Z13.820 diagnosis was primary, then it would be covered.  She is requesting that the code be changed and someone call her and confirm that it was changed.  I have no access to the orders or putting diagnosis codes on orders, so I assume this will have to be changed by clinical staff.  Please advise.

## 2015-07-05 NOTE — Telephone Encounter (Signed)
i talked to rosie said this has to be changed on the billing side and she is working on this issue.

## 2015-07-07 NOTE — Telephone Encounter (Signed)
Patient called to check on this.  Can you please call her and give her update on the status of this today or tomorrow?

## 2015-07-07 NOTE — Telephone Encounter (Signed)
I have reached out to Amy in billing. I am awaiting a response. I would rather wait for a complete re

## 2015-07-12 NOTE — Telephone Encounter (Signed)
Patient called to check on status of this, explained that we are aware and that calls have been placed to the billing department to see what if anything can be done to get this fixed Patient states that insurance told her that "screening for osteoporosis" needs to be listed on claim as primary diagnosis & test will be covered 100% Patient states she received a bill from [facility] for this Explained that we will let her know once we find out how to fix this (if it's possible to be fixed) Please notify pt when all this is done

## 2015-07-21 ENCOUNTER — Other Ambulatory Visit: Payer: Self-pay | Admitting: Family Medicine

## 2015-07-21 DIAGNOSIS — Z1231 Encounter for screening mammogram for malignant neoplasm of breast: Secondary | ICD-10-CM

## 2015-07-28 ENCOUNTER — Encounter: Payer: Self-pay | Admitting: Family Medicine

## 2015-08-01 ENCOUNTER — Ambulatory Visit (HOSPITAL_COMMUNITY)
Admission: RE | Admit: 2015-08-01 | Discharge: 2015-08-01 | Disposition: A | Discharge status: Home / Self Care | Payer: BLUE CROSS/BLUE SHIELD | Source: Ambulatory Visit | Attending: Family Medicine | Admitting: Family Medicine

## 2015-08-01 DIAGNOSIS — Z1231 Encounter for screening mammogram for malignant neoplasm of breast: Secondary | ICD-10-CM | POA: Diagnosis not present

## 2015-08-26 NOTE — Telephone Encounter (Signed)
Patient called to check on this.  She is hoping for a call back today [phone].

## 2016-03-28 DIAGNOSIS — M79604 Pain in right leg: Secondary | ICD-10-CM | POA: Diagnosis not present

## 2016-03-28 DIAGNOSIS — R600 Localized edema: Secondary | ICD-10-CM | POA: Diagnosis not present

## 2016-03-28 DIAGNOSIS — M79605 Pain in left leg: Secondary | ICD-10-CM | POA: Diagnosis not present

## 2016-04-11 DIAGNOSIS — I8312 Varicose veins of left lower extremity with inflammation: Secondary | ICD-10-CM | POA: Diagnosis not present

## 2016-04-11 DIAGNOSIS — I8311 Varicose veins of right lower extremity with inflammation: Secondary | ICD-10-CM | POA: Diagnosis not present

## 2016-05-25 ENCOUNTER — Ambulatory Visit: Payer: Self-pay | Admitting: Ophthalmology

## 2016-05-25 ENCOUNTER — Encounter (HOSPITAL_COMMUNITY): Payer: Self-pay | Admitting: *Deleted

## 2016-05-25 DIAGNOSIS — H33011 Retinal detachment with single break, right eye: Secondary | ICD-10-CM | POA: Diagnosis not present

## 2016-05-25 DIAGNOSIS — H4311 Vitreous hemorrhage, right eye: Secondary | ICD-10-CM | POA: Diagnosis not present

## 2016-05-25 DIAGNOSIS — H5213 Myopia, bilateral: Secondary | ICD-10-CM | POA: Diagnosis not present

## 2016-05-25 DIAGNOSIS — H35362 Drusen (degenerative) of macula, left eye: Secondary | ICD-10-CM | POA: Diagnosis not present

## 2016-05-25 NOTE — Progress Notes (Signed)
Pre-op instructions given to pt only. Please complete assessments on DOS. Pt denies SOB, chest pain, and being under the care of a cardiologist. Pt denies having a stress test, echo and cardiac cath. Pt denies having an EKG and chest x ray within the last year. Pt denies having recent labs. Pt made aware to stop taking Aspirin, vitamins, fish oil and herbal medications. Do not take any NSAIDs ie: Ibuprofen, Advil, Naproxen, BC and Goody Powder or any medication containing Aspirin. Pt verbalized understanding of all pre-op instructions.

## 2016-05-25 NOTE — H&P (Signed)
  Date of examination:  05/25/16  Indication for surgery: Vitreous hemorrhage with associated reitinal detachment  Pertinent past medical history:  Past Medical History:  Diagnosis Date  . Bad odor of urine 06/30/2013  . Cancer ([place])    skin cancer   . Hematuria 06/30/2013  . PMB (postmenopausal bleeding) 05/02/2015  . Vaginal atrophy 06/30/2013    Pertinent ocular history:  Acute vision loss in the past 24 hours.  Pertinent family history:  Family History  Problem Relation Age of Onset  . Alzheimer's disease Father   . Cancer Sister     ovarian, pancreatic  . Epilepsy Sister   . Heart attack Maternal Grandfather   . Osteoporosis Mother     General:  Healthy appearing patient in no distress.   Eyes:    Acuity OD CF at 71ft.  OS 20/25  Cc  External: Within normal limits    Anterior segment: Within normal limits     Motility:   Full OU  Fundus: No view due to vitreous hemorrhageNormal OS          Impression:Vitreous hemorrhage with retinal detachment.  Plan: Pars plana vtirecotmy with laser, air/fluid exchange and gas  Royston Cowper

## 2016-05-26 ENCOUNTER — Ambulatory Visit (HOSPITAL_COMMUNITY): Payer: BLUE CROSS/BLUE SHIELD | Admitting: Anesthesiology

## 2016-05-26 ENCOUNTER — Encounter (HOSPITAL_COMMUNITY): Payer: Self-pay | Admitting: *Deleted

## 2016-05-26 ENCOUNTER — Encounter (HOSPITAL_COMMUNITY)
Admission: RE | Disposition: A | Discharge status: Home / Self Care | Payer: Self-pay | Source: Ambulatory Visit | Attending: Ophthalmology

## 2016-05-26 ENCOUNTER — Ambulatory Visit (HOSPITAL_COMMUNITY)
Admission: RE | Admit: 2016-05-26 | Discharge: 2016-05-26 | Disposition: A | Discharge status: Home / Self Care | Payer: BLUE CROSS/BLUE SHIELD | Source: Ambulatory Visit | Attending: Ophthalmology | Admitting: Ophthalmology

## 2016-05-26 DIAGNOSIS — H3321 Serous retinal detachment, right eye: Secondary | ICD-10-CM | POA: Insufficient documentation

## 2016-05-26 DIAGNOSIS — H4311 Vitreous hemorrhage, right eye: Secondary | ICD-10-CM | POA: Diagnosis not present

## 2016-05-26 DIAGNOSIS — M1990 Unspecified osteoarthritis, unspecified site: Secondary | ICD-10-CM | POA: Diagnosis not present

## 2016-05-26 DIAGNOSIS — H33011 Retinal detachment with single break, right eye: Secondary | ICD-10-CM | POA: Diagnosis not present

## 2016-05-26 HISTORY — PX: PARS PLANA VITRECTOMY: SHX2166

## 2016-05-26 HISTORY — DX: Headache, unspecified: R51.9

## 2016-05-26 HISTORY — DX: Unspecified osteoarthritis, unspecified site: M19.90

## 2016-05-26 HISTORY — DX: Headache: R51

## 2016-05-26 LAB — CBC
HCT: 42.4 % (ref 36.0–46.0)
HEMOGLOBIN: 13.9 g/dL (ref 12.0–15.0)
MCH: 29.1 pg (ref 26.0–34.0)
MCHC: 32.8 g/dL (ref 30.0–36.0)
MCV: 88.7 fL (ref 78.0–100.0)
Platelets: 243 10*3/uL (ref 150–400)
RBC: 4.78 MIL/uL (ref 3.87–5.11)
RDW: 12.6 % (ref 11.5–15.5)
WBC: 5.4 10*3/uL (ref 4.0–10.5)

## 2016-05-26 SURGERY — PARS PLANA VITRECTOMY WITH 25 GAUGE
Anesthesia: Monitor Anesthesia Care | Site: Eye | Laterality: Right

## 2016-05-26 MED ORDER — MIDAZOLAM HCL 2 MG/2ML IJ SOLN
INTRAMUSCULAR | Status: AC
  Filled 2016-05-26: qty 2

## 2016-05-26 MED ORDER — DEXAMETHASONE SODIUM PHOSPHATE 10 MG/ML IJ SOLN
INTRAMUSCULAR | Status: DC | PRN
  Administered 2016-05-26: .5 mL via INTRAVENOUS

## 2016-05-26 MED ORDER — PROMETHAZINE HCL 25 MG/ML IJ SOLN
6.2500 mg | INTRAMUSCULAR | Status: DC | PRN
Start: 2016-05-26 — End: 2016-05-26

## 2016-05-26 MED ORDER — PROPOFOL 10 MG/ML IV BOLUS
INTRAVENOUS | Status: AC
  Filled 2016-05-26: qty 20

## 2016-05-26 MED ORDER — TETRACAINE HCL 0.5 % OP SOLN
OPHTHALMIC | Status: DC | PRN
  Administered 2016-05-26: 2 [drp] via OPHTHALMIC

## 2016-05-26 MED ORDER — TOBRAMYCIN-DEXAMETHASONE 0.3-0.1 % OP OINT
TOPICAL_OINTMENT | OPHTHALMIC | Status: DC | PRN
  Administered 2016-05-26: 1 via OPHTHALMIC

## 2016-05-26 MED ORDER — GLYCOPYRROLATE 0.2 MG/ML IJ SOLN
INTRAMUSCULAR | Status: DC | PRN
  Administered 2016-05-26: 0.2 mg via INTRAVENOUS

## 2016-05-26 MED ORDER — ARTIFICIAL TEARS OPHTHALMIC OINT
TOPICAL_OINTMENT | OPHTHALMIC | Status: AC
  Filled 2016-05-26: qty 3.5

## 2016-05-26 MED ORDER — FENTANYL CITRATE (PF) 100 MCG/2ML IJ SOLN: 25.0000 ug | INTRAMUSCULAR | Status: DC | PRN

## 2016-05-26 MED ORDER — PROPOFOL 10 MG/ML IV BOLUS
INTRAVENOUS | Status: DC | PRN
  Administered 2016-05-26 (×2): 20 mg via INTRAVENOUS
  Administered 2016-05-26: 50 mg via INTRAVENOUS
  Administered 2016-05-26 (×2): 10 mg via INTRAVENOUS
  Administered 2016-05-26: 40 mg via INTRAVENOUS
  Administered 2016-05-26: 20 mg via INTRAVENOUS
  Administered 2016-05-26: 30 mg via INTRAVENOUS
  Administered 2016-05-26 (×2): 20 mg via INTRAVENOUS

## 2016-05-26 MED ORDER — BSS IO SOLN
INTRAOCULAR | Status: DC | PRN
  Administered 2016-05-26: 15 mL via INTRAOCULAR

## 2016-05-26 MED ORDER — EPINEPHRINE PF 1 MG/ML IJ SOLN
INTRAMUSCULAR | Status: AC
  Filled 2016-05-26: qty 1

## 2016-05-26 MED ORDER — PHENYLEPHRINE 40 MCG/ML (10ML) SYRINGE FOR IV PUSH (FOR BLOOD PRESSURE SUPPORT)
PREFILLED_SYRINGE | INTRAVENOUS | Status: AC
  Filled 2016-05-26: qty 10

## 2016-05-26 MED ORDER — FENTANYL CITRATE (PF) 250 MCG/5ML IJ SOLN
INTRAMUSCULAR | Status: AC
  Filled 2016-05-26: qty 5

## 2016-05-26 MED ORDER — HYPROMELLOSE (GONIOSCOPIC) 2.5 % OP SOLN
OPHTHALMIC | Status: DC | PRN
  Administered 2016-05-26: 2 [drp] via OPHTHALMIC

## 2016-05-26 MED ORDER — BSS IO SOLN
INTRAOCULAR | Status: AC
  Filled 2016-05-26: qty 15

## 2016-05-26 MED ORDER — EPINEPHRINE PF 1 MG/ML IJ SOLN
INTRAOCULAR | Status: DC | PRN
  Administered 2016-05-26: .3 mL

## 2016-05-26 MED ORDER — TETRACAINE HCL 0.5 % OP SOLN
OPHTHALMIC | Status: AC
  Filled 2016-05-26: qty 2

## 2016-05-26 MED ORDER — MIDAZOLAM HCL 5 MG/5ML IJ SOLN
INTRAMUSCULAR | Status: DC | PRN
  Administered 2016-05-26: 2 mg via INTRAVENOUS

## 2016-05-26 MED ORDER — HYPROMELLOSE (GONIOSCOPIC) 2.5 % OP SOLN
OPHTHALMIC | Status: AC
  Filled 2016-05-26: qty 15

## 2016-05-26 MED ORDER — SODIUM CHLORIDE 0.9 % IJ SOLN
INTRAMUSCULAR | Status: AC
  Filled 2016-05-26: qty 10

## 2016-05-26 MED ORDER — BUPIVACAINE HCL (PF) 0.75 % IJ SOLN
INTRAMUSCULAR | Status: AC
  Filled 2016-05-26: qty 10

## 2016-05-26 MED ORDER — TOBRAMYCIN-DEXAMETHASONE 0.3-0.1 % OP OINT
TOPICAL_OINTMENT | OPHTHALMIC | Status: AC
  Filled 2016-05-26: qty 3.5

## 2016-05-26 MED ORDER — ONDANSETRON HCL 4 MG/2ML IJ SOLN
INTRAMUSCULAR | Status: DC | PRN
  Administered 2016-05-26: 4 mg via INTRAVENOUS

## 2016-05-26 MED ORDER — ONDANSETRON HCL 4 MG/2ML IJ SOLN
INTRAMUSCULAR | Status: AC
  Filled 2016-05-26: qty 2

## 2016-05-26 MED ORDER — TROPICAMIDE 1 % OP SOLN
1.0000 [drp] | OPHTHALMIC | Status: AC | PRN
  Administered 2016-05-26 (×3): 1 [drp] via OPHTHALMIC
  Filled 2016-05-26: qty 15

## 2016-05-26 MED ORDER — OFLOXACIN 0.3 % OP SOLN
1.0000 [drp] | OPHTHALMIC | Status: AC | PRN
  Administered 2016-05-26 (×3): 1 [drp] via OPHTHALMIC
  Filled 2016-05-26: qty 5

## 2016-05-26 MED ORDER — FENTANYL CITRATE (PF) 100 MCG/2ML IJ SOLN
INTRAMUSCULAR | Status: DC | PRN
  Administered 2016-05-26 (×3): 25 ug via INTRAVENOUS

## 2016-05-26 MED ORDER — LIDOCAINE HCL 2 % IJ SOLN
INTRAMUSCULAR | Status: AC
Start: 2016-05-26 — End: 2016-05-26
  Filled 2016-05-26: qty 20

## 2016-05-26 MED ORDER — ATROPINE SULFATE 1 % OP SOLN
OPHTHALMIC | Status: AC
  Filled 2016-05-26: qty 5

## 2016-05-26 MED ORDER — BUPIVACAINE IN DEXTROSE 0.75-8.25 % IT SOLN
INTRATHECAL | Status: AC
  Filled 2016-05-26: qty 2

## 2016-05-26 MED ORDER — BSS PLUS IO SOLN
INTRAOCULAR | Status: DC | PRN
  Administered 2016-05-26: 1 via INTRAOCULAR

## 2016-05-26 MED ORDER — SODIUM CHLORIDE 0.9 % IV SOLN
INTRAVENOUS | Status: DC
  Administered 2016-05-26 (×3): via INTRAVENOUS

## 2016-05-26 MED ORDER — DEXAMETHASONE SODIUM PHOSPHATE 10 MG/ML IJ SOLN
INTRAMUSCULAR | Status: AC
  Filled 2016-05-26: qty 1

## 2016-05-26 MED ORDER — LIDOCAINE HCL 2 % IJ SOLN
INTRAMUSCULAR | Status: DC | PRN
  Administered 2016-05-26: 10 mL via RETROBULBAR

## 2016-05-26 MED ORDER — BSS PLUS IO SOLN
INTRAOCULAR | Status: AC
  Filled 2016-05-26: qty 500

## 2016-05-26 SURGICAL SUPPLY — 53 items
APPLICATOR COTTON TIP 6IN STRL (MISCELLANEOUS) ×3 IMPLANT
BLADE MVR KNIFE 20G (BLADE) IMPLANT
CANNULA ANT CHAM MAIN (OPHTHALMIC RELATED) IMPLANT
CANNULA DUAL BORE 23G (CANNULA) IMPLANT
CANNULA DUALBORE 25G (CANNULA) IMPLANT
CANNULA VLV SOFT TIP 25GA (OPHTHALMIC) ×6 IMPLANT
CAUTERY EYE LOW TEMP 1300F FIN (OPHTHALMIC RELATED) IMPLANT
CLOSURE STERI-STRIP 1/2X4 (GAUZE/BANDAGES/DRESSINGS) ×1
CLSR STERI-STRIP ANTIMIC 1/2X4 (GAUZE/BANDAGES/DRESSINGS) ×2 IMPLANT
CORDS BIPOLAR (ELECTRODE) IMPLANT
COVER MAYO STAND STRL (DRAPES) ×3 IMPLANT
DRAPE HALF SHEET 40X57 (DRAPES) ×3 IMPLANT
DRAPE INCISE 51X51 W/FILM STRL (DRAPES) ×3 IMPLANT
DRAPE RETRACTOR (MISCELLANEOUS) ×3 IMPLANT
ERASER HMR WETFIELD 23G BP (MISCELLANEOUS) IMPLANT
FORCEPS ECKARDT ILM 25G SERR (OPHTHALMIC RELATED) IMPLANT
FORCEPS GRIESHABER ILM 25G A (INSTRUMENTS) IMPLANT
GAS AUTO FILL CONSTEL (OPHTHALMIC)
GAS AUTO FILL CONSTELLATION (OPHTHALMIC) IMPLANT
GLOVE BIOGEL PI IND STRL 7.5 (GLOVE) ×1 IMPLANT
GLOVE BIOGEL PI INDICATOR 7.5 (GLOVE) ×2
GOWN STRL REUS W/ TWL LRG LVL3 (GOWN DISPOSABLE) ×1 IMPLANT
GOWN STRL REUS W/TWL LRG LVL3 (GOWN DISPOSABLE) ×2
HANDLE PNEUMATIC FOR CONSTEL (OPHTHALMIC) IMPLANT
KIT BASIN OR (CUSTOM PROCEDURE TRAY) ×3 IMPLANT
KIT ROOM TURNOVER OR (KITS) ×3 IMPLANT
LENS BIOM SUPER VIEW SET DISP (OPHTHALMIC RELATED) ×3 IMPLANT
MICROPICK 25G (MISCELLANEOUS)
NEEDLE 18GX1X1/2 (RX/OR ONLY) (NEEDLE) ×3 IMPLANT
NEEDLE 25GX 5/8IN NON SAFETY (NEEDLE) ×3 IMPLANT
NEEDLE FILTER BLUNT 18X 1/2SAF (NEEDLE) ×2
NEEDLE FILTER BLUNT 18X1 1/2 (NEEDLE) ×1 IMPLANT
NEEDLE HYPO 25GX1X1/2 BEV (NEEDLE) IMPLANT
NEEDLE HYPO 30X.5 LL (NEEDLE) ×6 IMPLANT
NEEDLE RETROBULBAR 25GX1.5 (NEEDLE) ×3 IMPLANT
NS IRRIG 1000ML POUR BTL (IV SOLUTION) ×3 IMPLANT
PACK VITRECTOMY CUSTOM (CUSTOM PROCEDURE TRAY) ×3 IMPLANT
PAD ARMBOARD 7.5X6 YLW CONV (MISCELLANEOUS) ×6 IMPLANT
PAK PIK VITRECTOMY CVS 25GA (OPHTHALMIC) ×3 IMPLANT
PENCIL BIPOLAR 25GA STR DISP (OPHTHALMIC RELATED) IMPLANT
PICK MICROPICK 25G (MISCELLANEOUS) IMPLANT
PROBE LASER ILLUM FLEX CVD 25G (OPHTHALMIC) IMPLANT
ROLLS DENTAL (MISCELLANEOUS) IMPLANT
SCRAPER DIAMOND 25GA (OPHTHALMIC RELATED) IMPLANT
STOPCOCK 4 WAY LG BORE MALE ST (IV SETS) IMPLANT
SUT VICRYL 7 0 TG140 8 (SUTURE) ×3 IMPLANT
SYR 10ML LL (SYRINGE) IMPLANT
SYR 20CC LL (SYRINGE) ×3 IMPLANT
SYR 5ML LL (SYRINGE) ×6 IMPLANT
SYR TB 1ML LUER SLIP (SYRINGE) IMPLANT
TOWEL OR 17X24 6PK STRL BLUE (TOWEL DISPOSABLE) ×6 IMPLANT
WATER STERILE IRR 1000ML POUR (IV SOLUTION) ×3 IMPLANT
WIPE INSTRUMENT VISIWIPE 73X73 (MISCELLANEOUS) IMPLANT

## 2016-05-26 NOTE — Op Note (Signed)
Amy Kelley 05/26/2016 Diagnosis: Retinal detachment right eye with vitreous hemorrhage  Procedure: Retinal detachment repair right eye with Pars Plana Vitrectomy, Endolaser, Fluid Gas Exchange and placement of SF6 gas Operative Eye:  right eye  Surgeon: Royston Cowper Estimated Blood Loss: minimal Specimens for Pathology:  None Complications: none   The  patient was prepped and draped in the usual fashion for ocular surgery on the  right eye .  A lid speculum was placed.  Infusion line and trocar was placed at the 8 o'clock position approximately 3.5 mm from the surgical limbus.   The infusion line was allowed to run and then clamped when placed at the cannula opening. The line was inserted and secured to the drape with an adhesive strip.   Active trocars/cannula were placed at the 10 and 2 o'clock positions approximately 3.5 mm from the surgical limbus. The cannula was visualized in the vitreous cavity.  The light pipe and vitreous cutter were inserted into the vitreous cavity and a core vitrectomy was performed.  Care taken to remove the vitreous up to the vitreous base for 360 degrees.  Due to the degree of hemorrhage, additional washout of the posterior chamber was performed.   A localized retinal detachment was noted at 2:00 with associated bleeding bridging vessel.   The vitreous was carefully shaved over the break and detachment with the aid of scleral depression.   3 rows of endolaser were applied 360 degrees to the periphery with the aid of scleral depression  A complete air-fluid exchange was performed.  16% SF6 gas was injected into the eye.  The superior cannulas were sequentially removed with concommitant tamponade using a cotton tipped applicator and noted to be air tight.  The infusion line and trocar were removed and the sclerotomy was noted to be air tight with normal intraocular pressure by digital palpapation.  Subconjunctival injections of  Dexamethasone 4mg /79ml was  placed in the infero-medial quadrant.   The speculum and drapes were removed and the eye was patched with Polymixin/Bacitracin ophthalmic ointment. An eye shield was placed and the patient was transferred alert and conversant with stable vital signs to the post operative recovery area.  The patient tolerated the procedure well and no complications were noted.  Royston Cowper MD

## 2016-05-26 NOTE — Brief Op Note (Signed)
05/26/2016  9:03 AM  PATIENT:  Amy Kelley  61 y.o. female  PRE-OPERATIVE DIAGNOSIS:  vitreous hemorrage with retinal detachment right eye  POST-OPERATIVE DIAGNOSIS:  vitreous hemorrage with retinal detachment right eye  PROCEDURE:  Procedure(s): Retinal Detachment repair with PARS PLANA VITRECTOMY WITH ENDO LASER, SF6 INJECTION (Right)  SURGEON:  Surgeon(s) and Role:    Jalene Mullet, MD - Primary  PHYSICIAN ASSISTANT:   ASSISTANTS: none   ANESTHESIA:   local and MAC  EBL:  Total I/O In: 500 [I.V.:500] Out: -   BLOOD ADMINISTERED:none  DRAINS: none   LOCAL MEDICATIONS USED:  MARCAINE    and LIDOCAINE   SPECIMEN:  No Specimen  DISPOSITION OF SPECIMEN:  N/A  COUNTS:  YES  TOURNIQUET:  * No tourniquets in log *  DICTATION: .Note written in EPIC  PLAN OF CARE: Discharge to home after PACU  PATIENT DISPOSITION:  PACU - hemodynamically stable.   Delay start of Pharmacological VTE agent (>24hrs) due to surgical blood loss or risk of bleeding: not applicable

## 2016-05-26 NOTE — Anesthesia Preprocedure Evaluation (Addendum)
Anesthesia Evaluation    Airway Mallampati: II  TM Distance: >3 FB Neck ROM: Full    Dental  (+) Teeth Intact, Dental Advisory Given   Pulmonary    Pulmonary exam normal breath sounds clear to auscultation       Cardiovascular Exercise Tolerance: Good Normal cardiovascular exam Rhythm:Regular Rate:Normal     Neuro/Psych  Headaches, negative psych ROS   GI/Hepatic   Endo/Other    Renal/GU      Musculoskeletal  (+) Arthritis , Osteoarthritis,    Abdominal   Peds  Hematology   Anesthesia Other Findings   Reproductive/Obstetrics                             Anesthesia Physical Anesthesia Plan  ASA: II  Anesthesia Plan: MAC   Post-op Pain Management:    Induction: Intravenous  Airway Management Planned: Nasal Cannula  Additional Equipment:   Intra-op Plan:   Post-operative Plan:   Informed Consent: I have reviewed the patients History and Physical, chart, labs and discussed the procedure including the risks, benefits and alternatives for the proposed anesthesia with the patient or authorized representative who has indicated his/her understanding and acceptance.   Dental advisory given  Plan Discussed with: CRNA  Anesthesia Plan Comments: (Discussed risks/benefits/alternatives to MAC sedation including need for ventilatory support, hypotension, need for conversion to general anesthesia.  All patient questions answered.  Patient/guardian wishes to proceed.)       Anesthesia Quick Evaluation

## 2016-05-26 NOTE — Anesthesia Postprocedure Evaluation (Signed)
Anesthesia Post Note  Patient: Amy Kelley  Procedure(s) Performed: Procedure(s) (LRB): PARS PLANA VITRECTOMY WITH ENDO LASER, SF6 INJECTION (Right)  Patient location during evaluation: PACU Anesthesia Type: MAC Level of consciousness: awake and alert Pain management: pain level controlled Vital Signs Assessment: post-procedure vital signs reviewed and stable Respiratory status: spontaneous breathing, nonlabored ventilation, respiratory function stable and patient connected to nasal cannula oxygen Cardiovascular status: stable and blood pressure returned to baseline Anesthetic complications: no       Last Vitals:  Vitals:   05/26/16 0921 05/26/16 0930  BP: 132/87   Pulse: 68 69  Resp: (!) 9 12  Temp: 36.4 C     Last Pain:  Vitals:   05/26/16 0603  TempSrc: Oral                 Catalina Gravel

## 2016-05-26 NOTE — Transfer of Care (Signed)
Immediate Anesthesia Transfer of Care Note  Patient: Amy Kelley  Procedure(s) Performed: Procedure(s): PARS PLANA VITRECTOMY WITH ENDO LASER, SF6 INJECTION (Right)  Patient Location: PACU  Anesthesia Type:MAC  Level of Consciousness: awake, alert  and oriented  Airway & Oxygen Therapy: Patient Spontanous Breathing  Post-op Assessment: Report given to RN and Post -op Vital signs reviewed and stable  Post vital signs: Reviewed and stable  Last Vitals:  Vitals:   05/26/16 0603  BP: 117/82  Pulse: 64  Resp: 18  Temp: 36.8 C    Last Pain:  Vitals:   05/26/16 0603  TempSrc: Oral      Patients Stated Pain Goal: 5 (03/49/17 9150)  Complications: No apparent anesthesia complications

## 2016-05-28 ENCOUNTER — Encounter (HOSPITAL_COMMUNITY): Payer: Self-pay | Admitting: Ophthalmology

## 2016-06-13 DIAGNOSIS — M8580 Other specified disorders of bone density and structure, unspecified site: Secondary | ICD-10-CM | POA: Diagnosis not present

## 2016-06-13 DIAGNOSIS — Z0389 Encounter for observation for other suspected diseases and conditions ruled out: Secondary | ICD-10-CM | POA: Diagnosis not present

## 2016-06-13 DIAGNOSIS — E559 Vitamin D deficiency, unspecified: Secondary | ICD-10-CM | POA: Diagnosis not present

## 2016-06-13 DIAGNOSIS — E785 Hyperlipidemia, unspecified: Secondary | ICD-10-CM | POA: Diagnosis not present

## 2016-06-13 DIAGNOSIS — Z0000 Encounter for general adult medical examination without abnormal findings: Secondary | ICD-10-CM | POA: Diagnosis not present

## 2016-06-13 DIAGNOSIS — R6882 Decreased libido: Secondary | ICD-10-CM | POA: Diagnosis not present

## 2016-06-20 DIAGNOSIS — E785 Hyperlipidemia, unspecified: Secondary | ICD-10-CM | POA: Diagnosis not present

## 2016-06-20 DIAGNOSIS — Z713 Dietary counseling and surveillance: Secondary | ICD-10-CM | POA: Diagnosis not present

## 2016-06-20 DIAGNOSIS — M8580 Other specified disorders of bone density and structure, unspecified site: Secondary | ICD-10-CM | POA: Diagnosis not present

## 2016-06-20 DIAGNOSIS — E559 Vitamin D deficiency, unspecified: Secondary | ICD-10-CM | POA: Diagnosis not present

## 2016-06-20 DIAGNOSIS — R6882 Decreased libido: Secondary | ICD-10-CM | POA: Diagnosis not present

## 2017-10-21 DIAGNOSIS — Z961 Presence of intraocular lens: Secondary | ICD-10-CM | POA: Diagnosis not present

## 2017-10-21 DIAGNOSIS — H2512 Age-related nuclear cataract, left eye: Secondary | ICD-10-CM | POA: Diagnosis not present

## 2017-10-21 DIAGNOSIS — H33051 Total retinal detachment, right eye: Secondary | ICD-10-CM | POA: Diagnosis not present

## 2017-10-21 DIAGNOSIS — H2511 Age-related nuclear cataract, right eye: Secondary | ICD-10-CM | POA: Diagnosis not present

## 2017-10-24 DIAGNOSIS — Z23 Encounter for immunization: Secondary | ICD-10-CM | POA: Diagnosis not present

## 2017-10-29 DIAGNOSIS — H26492 Other secondary cataract, left eye: Secondary | ICD-10-CM | POA: Diagnosis not present

## 2017-11-05 DIAGNOSIS — Z961 Presence of intraocular lens: Secondary | ICD-10-CM | POA: Diagnosis not present

## 2017-11-05 DIAGNOSIS — H338 Other retinal detachments: Secondary | ICD-10-CM | POA: Diagnosis not present

## 2017-11-05 DIAGNOSIS — H31091 Other chorioretinal scars, right eye: Secondary | ICD-10-CM | POA: Diagnosis not present

## 2017-11-05 DIAGNOSIS — H35371 Puckering of macula, right eye: Secondary | ICD-10-CM | POA: Diagnosis not present

## 2017-12-03 DIAGNOSIS — R5383 Other fatigue: Secondary | ICD-10-CM | POA: Diagnosis not present

## 2017-12-03 DIAGNOSIS — M8580 Other specified disorders of bone density and structure, unspecified site: Secondary | ICD-10-CM | POA: Diagnosis not present

## 2017-12-03 DIAGNOSIS — R6882 Decreased libido: Secondary | ICD-10-CM | POA: Diagnosis not present

## 2017-12-03 DIAGNOSIS — E785 Hyperlipidemia, unspecified: Secondary | ICD-10-CM | POA: Diagnosis not present

## 2017-12-03 DIAGNOSIS — E559 Vitamin D deficiency, unspecified: Secondary | ICD-10-CM | POA: Diagnosis not present

## 2017-12-03 DIAGNOSIS — Z0000 Encounter for general adult medical examination without abnormal findings: Secondary | ICD-10-CM | POA: Diagnosis not present

## 2017-12-04 ENCOUNTER — Other Ambulatory Visit (HOSPITAL_COMMUNITY): Payer: Self-pay | Admitting: Internal Medicine

## 2017-12-04 DIAGNOSIS — Z780 Asymptomatic menopausal state: Secondary | ICD-10-CM

## 2017-12-16 ENCOUNTER — Ambulatory Visit (HOSPITAL_COMMUNITY)
Admission: RE | Admit: 2017-12-16 | Discharge: 2017-12-16 | Disposition: A | Discharge status: Home / Self Care | Payer: BLUE CROSS/BLUE SHIELD | Source: Ambulatory Visit | Attending: Internal Medicine | Admitting: Internal Medicine

## 2017-12-16 DIAGNOSIS — Z780 Asymptomatic menopausal state: Secondary | ICD-10-CM | POA: Diagnosis not present

## 2017-12-16 DIAGNOSIS — M85852 Other specified disorders of bone density and structure, left thigh: Secondary | ICD-10-CM | POA: Diagnosis not present

## 2017-12-23 DIAGNOSIS — D485 Neoplasm of uncertain behavior of skin: Secondary | ICD-10-CM | POA: Diagnosis not present

## 2018-01-01 DIAGNOSIS — L720 Epidermal cyst: Secondary | ICD-10-CM | POA: Diagnosis not present

## 2018-01-01 DIAGNOSIS — D485 Neoplasm of uncertain behavior of skin: Secondary | ICD-10-CM | POA: Diagnosis not present

## 2018-01-23 DIAGNOSIS — E559 Vitamin D deficiency, unspecified: Secondary | ICD-10-CM | POA: Diagnosis not present

## 2018-01-23 DIAGNOSIS — R5383 Other fatigue: Secondary | ICD-10-CM | POA: Diagnosis not present

## 2018-01-23 DIAGNOSIS — M8580 Other specified disorders of bone density and structure, unspecified site: Secondary | ICD-10-CM | POA: Diagnosis not present

## 2018-01-23 DIAGNOSIS — R6882 Decreased libido: Secondary | ICD-10-CM | POA: Diagnosis not present

## 2018-01-23 DIAGNOSIS — E785 Hyperlipidemia, unspecified: Secondary | ICD-10-CM | POA: Diagnosis not present

## 2018-01-23 DIAGNOSIS — Z713 Dietary counseling and surveillance: Secondary | ICD-10-CM | POA: Diagnosis not present

## 2018-01-23 DIAGNOSIS — M810 Age-related osteoporosis without current pathological fracture: Secondary | ICD-10-CM | POA: Diagnosis not present

## 2018-01-30 DIAGNOSIS — D485 Neoplasm of uncertain behavior of skin: Secondary | ICD-10-CM | POA: Diagnosis not present

## 2018-01-30 DIAGNOSIS — D23122 Other benign neoplasm of skin of left lower eyelid, including canthus: Secondary | ICD-10-CM | POA: Diagnosis not present

## 2018-01-30 DIAGNOSIS — Z09 Encounter for follow-up examination after completed treatment for conditions other than malignant neoplasm: Secondary | ICD-10-CM | POA: Diagnosis not present

## 2018-03-04 DIAGNOSIS — R6882 Decreased libido: Secondary | ICD-10-CM | POA: Diagnosis not present

## 2018-03-04 DIAGNOSIS — R5383 Other fatigue: Secondary | ICD-10-CM | POA: Diagnosis not present

## 2018-03-04 DIAGNOSIS — M810 Age-related osteoporosis without current pathological fracture: Secondary | ICD-10-CM | POA: Diagnosis not present

## 2018-03-04 DIAGNOSIS — M8580 Other specified disorders of bone density and structure, unspecified site: Secondary | ICD-10-CM | POA: Diagnosis not present

## 2018-03-04 DIAGNOSIS — E559 Vitamin D deficiency, unspecified: Secondary | ICD-10-CM | POA: Diagnosis not present

## 2018-04-15 ENCOUNTER — Encounter (INDEPENDENT_AMBULATORY_CARE_PROVIDER_SITE_OTHER): Payer: Self-pay | Admitting: Internal Medicine

## 2018-04-21 DIAGNOSIS — M8580 Other specified disorders of bone density and structure, unspecified site: Secondary | ICD-10-CM | POA: Diagnosis not present

## 2018-04-21 DIAGNOSIS — E559 Vitamin D deficiency, unspecified: Secondary | ICD-10-CM | POA: Diagnosis not present

## 2018-04-21 DIAGNOSIS — R5383 Other fatigue: Secondary | ICD-10-CM | POA: Diagnosis not present

## 2018-04-21 DIAGNOSIS — E785 Hyperlipidemia, unspecified: Secondary | ICD-10-CM | POA: Diagnosis not present

## 2018-04-21 DIAGNOSIS — M810 Age-related osteoporosis without current pathological fracture: Secondary | ICD-10-CM | POA: Diagnosis not present

## 2018-10-06 ENCOUNTER — Telehealth (INDEPENDENT_AMBULATORY_CARE_PROVIDER_SITE_OTHER): Payer: Self-pay

## 2018-10-06 NOTE — Telephone Encounter (Signed)
Called pt back.Left instructions to D/C all medications currently taking & call to schedule a acute visit soon. Left voicemail on home phone as requested earlier today.

## 2018-10-06 NOTE — Telephone Encounter (Signed)
I would recommend that she discontinue all her medications for the time being and make an appointment to see me soon.

## 2018-10-07 ENCOUNTER — Encounter (INDEPENDENT_AMBULATORY_CARE_PROVIDER_SITE_OTHER): Payer: Self-pay | Admitting: Internal Medicine

## 2018-10-07 ENCOUNTER — Ambulatory Visit (INDEPENDENT_AMBULATORY_CARE_PROVIDER_SITE_OTHER): Payer: BC Managed Care – PPO | Admitting: Internal Medicine

## 2018-10-07 ENCOUNTER — Other Ambulatory Visit: Payer: Self-pay

## 2018-10-07 VITALS — BP 130/80 | HR 72 | Ht 68.0 in | Wt 152.0 lb

## 2018-10-07 DIAGNOSIS — R21 Rash and other nonspecific skin eruption: Secondary | ICD-10-CM | POA: Diagnosis not present

## 2018-10-07 NOTE — Progress Notes (Signed)
   Wellness Office Visit  Subjective:  Patient ID: Amy Kelley, female    DOB: 07-Aug-1955  Age: 63 y.o. MRN: [id number]  CC: This lady comes in as an acute visit with a complaint of a skin rash. HPI She tells me that she went on vacation towards the end of July/beginning of August and spent a week.  On her return, she has noticed a rash in her upper back going across around her shoulders.  She denies being in contact with any specific chemicals but she thinks she may have got possible insect bites which were not typical. Since this time, she thinks that the rash has been improving and she has been trying some home remedies. She wonders whether hormones are causing this rash.  She continues to take the hormones despite the rash.  Past Medical History:  Diagnosis Date  . Arthritis   . Bad odor of urine 06/30/2013  . Cancer ([place])    skin cancer   . Headache    migraines  . Hematuria 06/30/2013  . PMB (postmenopausal bleeding) 05/02/2015  . Vaginal atrophy 06/30/2013      Family History  Problem Relation Age of Onset  . Alzheimer's disease Father   . Cancer Sister        ovarian, pancreatic  . Epilepsy Sister   . Heart attack Maternal Grandfather   . Osteoporosis Mother     Social History   Social History Narrative  . Not on file     Current Meds  Medication Sig  . calcium-vitamin D 250-100 MG-UNIT tablet Take 1 tablet by mouth daily.  . Cholecalciferol (VITAMIN D-3) 125 MCG (5000 UT) TABS Take 10,000 Units by mouth daily at 12 noon.  Marland Kitchen estradiol (ESTRACE) 1 MG tablet TAKE 1 & 1 2 (ONE & ONE HALF) TABLETS BY MOUTH ONCE DAILY  . Multiple Vitamin (MULTIVITAMIN WITH MINERALS) TABS tablet Take 1 tablet by mouth daily.  . NP THYROID 60 MG tablet Take 60 mg by mouth daily.  . progesterone (PROMETRIUM) 200 MG capsule TAKE 1 CAPSULE BY MOUTH ONCE DAILY AT NIGHT      Objective:   Today's Vitals: BP 130/80   Pulse 72   Ht 5\' 8"  (1.727 m)   Wt 152 lb (68.9 kg)   BMI 23.11  kg/m  Vitals with BMI 10/07/2018 05/26/2016 05/26/2016  Height 5\' 8"  - -  Weight 152 lbs - -  BMI [id number] - -  Systolic [id number] - [id number]  Diastolic 80 - 87  Pulse 72 69 68     Physical Exam  She looks systemically well and I cannot actually visualize a rash today.  Her skin is somewhat dry in the area in question.     Assessment   1. Rash and nonspecific skin eruption      Plan: 1. I reassured her that I do not think bioidentical hormones are causing this rash as she has been taking the hormones continuously and the rash has been improving.  I suspect the rash will resolve but if it does not, I may try a short course of oral prednisone.  She will let me know. 2. I spent 15 minutes with this patient face-to-face, more than 50% of the time was involved in discussing possible causes and answering her questions.  Tests ordered No orders of the defined types were placed in this encounter.    Doree Albee, MD

## 2018-10-23 DIAGNOSIS — H26491 Other secondary cataract, right eye: Secondary | ICD-10-CM | POA: Diagnosis not present

## 2018-10-23 DIAGNOSIS — D492 Neoplasm of unspecified behavior of bone, soft tissue, and skin: Secondary | ICD-10-CM | POA: Diagnosis not present

## 2018-10-23 DIAGNOSIS — H33051 Total retinal detachment, right eye: Secondary | ICD-10-CM | POA: Diagnosis not present

## 2018-10-23 DIAGNOSIS — Z961 Presence of intraocular lens: Secondary | ICD-10-CM | POA: Diagnosis not present

## 2018-11-04 ENCOUNTER — Other Ambulatory Visit (INDEPENDENT_AMBULATORY_CARE_PROVIDER_SITE_OTHER): Payer: Self-pay | Admitting: Internal Medicine

## 2018-11-29 ENCOUNTER — Other Ambulatory Visit (INDEPENDENT_AMBULATORY_CARE_PROVIDER_SITE_OTHER): Payer: Self-pay | Admitting: Internal Medicine

## 2018-12-10 ENCOUNTER — Other Ambulatory Visit: Payer: Self-pay

## 2018-12-10 ENCOUNTER — Encounter (INDEPENDENT_AMBULATORY_CARE_PROVIDER_SITE_OTHER): Payer: Self-pay | Admitting: Internal Medicine

## 2018-12-10 ENCOUNTER — Ambulatory Visit (INDEPENDENT_AMBULATORY_CARE_PROVIDER_SITE_OTHER): Payer: BC Managed Care – PPO | Admitting: Internal Medicine

## 2018-12-10 VITALS — BP 120/72 | HR 73 | Temp 96.3°F | Resp 18 | Ht 68.0 in | Wt 153.0 lb

## 2018-12-10 DIAGNOSIS — M810 Age-related osteoporosis without current pathological fracture: Secondary | ICD-10-CM | POA: Diagnosis not present

## 2018-12-10 DIAGNOSIS — E782 Mixed hyperlipidemia: Secondary | ICD-10-CM | POA: Diagnosis not present

## 2018-12-10 DIAGNOSIS — Z124 Encounter for screening for malignant neoplasm of cervix: Secondary | ICD-10-CM

## 2018-12-10 DIAGNOSIS — E559 Vitamin D deficiency, unspecified: Secondary | ICD-10-CM

## 2018-12-10 DIAGNOSIS — E2839 Other primary ovarian failure: Secondary | ICD-10-CM | POA: Diagnosis not present

## 2018-12-10 DIAGNOSIS — Z1159 Encounter for screening for other viral diseases: Secondary | ICD-10-CM

## 2018-12-10 DIAGNOSIS — Z1231 Encounter for screening mammogram for malignant neoplasm of breast: Secondary | ICD-10-CM

## 2018-12-10 DIAGNOSIS — Z0001 Encounter for general adult medical examination with abnormal findings: Secondary | ICD-10-CM

## 2018-12-10 DIAGNOSIS — E785 Hyperlipidemia, unspecified: Secondary | ICD-10-CM

## 2018-12-10 DIAGNOSIS — Z23 Encounter for immunization: Secondary | ICD-10-CM

## 2018-12-10 DIAGNOSIS — F5101 Primary insomnia: Secondary | ICD-10-CM

## 2018-12-10 HISTORY — DX: Hyperlipidemia, unspecified: E78.5

## 2018-12-10 HISTORY — DX: Vitamin D deficiency, unspecified: E55.9

## 2018-12-10 HISTORY — DX: Age-related osteoporosis without current pathological fracture: M81.0

## 2018-12-10 NOTE — Progress Notes (Signed)
Chief Complaint: This 63 year old lady comes in for her annual physical exam and to address her chronic conditions which are described below. HPI: She has a history of osteoporosis on a bone density scan that was done in 2019.  We have been working with her in terms of weightbearing exercises, bioidentical hormone therapy. We had checked her testosterone levels in the past and they have been on the low end of normal.  I discussed this with her in the past but she had not really thought more about it.  She is tolerating estradiol and progesterone at the current doses without any side effects. She also continues on vitamin D3 for vitamin D deficiency. She also continues on desiccated thyroid, which is being used off label, for symptoms of thyroid deficiency.  Past Medical History:  Diagnosis Date  . Arthritis   . Cancer ([facility])    skin cancer   . Headache    migraines  . Hematuria 06/30/2013  . HLD (hyperlipidemia) 12/10/2018  . Osteoporosis 12/10/2018  . PMB (postmenopausal bleeding) 05/02/2015  . Vaginal atrophy 06/30/2013  . Vitamin D deficiency disease 12/10/2018   Past Surgical History:  Procedure Laterality Date  . CESAREAN SECTION    . COLONOSCOPY N/A 10/15/2013   Procedure: COLONOSCOPY;  Surgeon: Rogene Houston, MD;  [facility];  Service: Endoscopy;  Laterality: N/A;  930  . PARS PLANA VITRECTOMY Right 05/26/2016   Procedure: PARS PLANA VITRECTOMY WITH ENDO LASER, SF6 INJECTION;  Surgeon: Jalene Mullet, MD;  Location: [place];  Service: Ophthalmology;  Laterality: Right;  . skin cancer removal     on left leg about 5 years ago or more.     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    Social History   Tobacco Use  . Smoking status: Never Smoker  . Smokeless tobacco: Never Used  Substance Use Topics  . Alcohol use: No      Allergies: No Known Allergies   Current Meds  Medication Sig  .  calcium-vitamin D 250-100 MG-UNIT tablet Take 1 tablet by mouth daily.  . Cholecalciferol (VITAMIN D-3) 125 MCG (5000 UT) TABS Take 10,000 Units by mouth daily at 12 noon.  Marland Kitchen estradiol (ESTRACE) 1 MG tablet TAKE 1 & 1 2 (ONE & ONE HALF) TABLETS BY MOUTH ONCE DAILY  . Multiple Vitamin (MULTIVITAMIN WITH MINERALS) TABS tablet Take 1 tablet by mouth daily.  . NP THYROID 60 MG tablet Take 1 tablet by mouth once daily  . progesterone (PROMETRIUM) 200 MG capsule TAKE 1 CAPSULE BY MOUTH ONCE DAILY AT NIGHT     Nutrition Variable.  Sleep She has interrupted sleep on a nightly basis and she did use melatonin with some other supplements a few days ago and this seems to have helped her.  Exercise None regular.  Bio-identical hormones This patient is being treated with desiccated thyroid, off label, for symptoms of thyroid deficiency.  The patient has been counseled regarding side effects and how to deal with them.  Micronized progesterone is being used in this patient for multiple benefits based on studies including protection against uterine cancer, breast cancer, osteoporosis and heart disease. The patient has been counseled regarding side effects, benefits and modes of administration. The patient is agreeable that this therapy is an integral part of her wellness, quality of life and prevention of chronic disease.  Estradiol is being used in this patient for multiple benefits based on several studies  including protection against heart disease, cerebrovascular disease, osteoporosis, colon cancer, Alzheimer's disease, macular degeneration and cataracts. The patient has been counseled regarding benefits and side effects and modes of administration. The patient is agreeable that this therapy is an integral to part of her wellness, quality of life and prevention of chronic disease.  VQX:IHWTU from the symptoms mentioned above,there are no other symptoms referable to all systems reviewed.  Physical  Exam: Blood pressure 120/72, pulse 73, temperature (!) 96.3 F (35.7 C), temperature source Temporal, resp. rate 18, height _0  (1.727 m), weight 153 lb (69.4 kg), SpO2 96 %. Vitals with BMI 12/10/2018 10/07/2018 05/26/2016  Height _1  _2  -  Weight 153 lbs 152 lbs -  BMI 88.28 00.34 -  Systolic 917 915 -  Diastolic 72 80 -  Pulse 73 72 69      She looks systemically well. General: Alert, cooperative, and appears to be the stated age.No pallor.  No jaundice.  No clubbing. Head: Normocephalic Eyes: Sclera white, pupils equal and reactive to light, red reflex x 2,  Ears: Normal bilaterally Oral cavity: Lips, mucosa, and tongue normal: Teeth and gums normal Neck: No adenopathy, supple, symmetrical, trachea midline, and thyroid does not appear enlarged. Breast: No masses felt. Respiratory: Clear to auscultation bilaterally.No wheezing, crackles or bronchial breathing. Cardiovascular: Heart sounds are present and appear to be normal without murmurs or added sounds.  No carotid bruits.  Peripheral pulses are present and equal bilaterally.: Gastrointestinal:positive bowel sounds, no hepatosplenomegaly.  No masses felt.No tenderness. Skin: Clear, No rashes noted.No worrisome skin lesions seen. Neurological: Grossly intact without focal findings, cranial nerves II through XII intact, muscle strength equal bilaterally Musculoskeletal: No acute joint abnormalities noted.Full range of movement noted with joints. Psychiatric: Affect appropriate, non-anxious.    Assessment  1. Encounter for screening mammogram for malignant neoplasm of breast   2. Vitamin D deficiency disease   3. Mixed hyperlipidemia   4. Age-related osteoporosis without current pathological fracture   5. Cervical cancer screening   6. Encounter for hepatitis C screening test for low risk patient   7. Primary insomnia   8. Encounter for general adult medical examination with abnormal findings   9. Primary ovarian  failure     Tests Ordered:   Orders Placed This Encounter  Procedures  . MM 3D SCREEN BREAST BILATERAL  . CBC  . CMP with eGFR([facility])  . Estradiol  . Hep C Antibody  . Lipid Panel  . Progesterone  . T3, Free  . TSH  . Vitamin D, 25-hydroxy  . Ambulatory referral to Obstetrics / Gynecology     Plan  1. Blood work is ordered above. 2. I will refer her for mammogram which she is overdue. 3. I will send her to gynecology for a Pap smear. 4. She will continue with vitamin D3 supplementation for vitamin D deficiency. 5. I discussed weightbearing exercises again and in particular walking and squats. 6. She will continue with bioidentical hormone therapy and I also broached the subject again of testosterone therapy.  She will think about this again and we will address this in more detail on the next visit. 7. Further recommendations will depend on blood results and I will see her in 2 months time for follow-up. 8. Today, in addition to a preventative visit, I performed an office visit to address her chronic conditions above.     No orders of the defined types were placed in this encounter.     C     12/10/2018, 11:48 AM

## 2018-12-11 LAB — COMPLETE METABOLIC PANEL WITH GFR
AG Ratio: 1.5 (calc) (ref 1.0–2.5)
ALT: 22 U/L (ref 6–29)
AST: 28 U/L (ref 10–35)
Albumin: 4.3 g/dL (ref 3.6–5.1)
Alkaline phosphatase (APISO): 88 U/L (ref 37–153)
BUN: 12 mg/dL (ref 7–25)
CO2: 24 mmol/L (ref 20–32)
Calcium: 9.4 mg/dL (ref 8.6–10.4)
Chloride: 102 mmol/L (ref 98–110)
Creat: 0.8 mg/dL (ref 0.50–0.99)
GFR, Est African American: 90 mL/min/{1.73_m2} (ref >=60)
GFR, Est Non African American: 78 mL/min/{1.73_m2} (ref >=60)
Globulin: 2.9 g/dL (calc) (ref 1.9–3.7)
Glucose, Bld: 92 mg/dL (ref 65–99)
Potassium: 4 mmol/L (ref 3.5–5.3)
Sodium: 139 mmol/L (ref 135–146)
Total Bilirubin: 0.5 mg/dL (ref 0.2–1.2)
Total Protein: 7.2 g/dL (ref 6.1–8.1)

## 2018-12-11 LAB — CBC
HCT: 44.2 % (ref 35.0–45.0)
Hemoglobin: 14.8 g/dL (ref 11.7–15.5)
MCH: 29.7 pg (ref 27.0–33.0)
MCHC: 33.5 g/dL (ref 32.0–36.0)
MCV: 88.6 fL (ref 80.0–100.0)
MPV: 9.6 fL (ref 7.5–12.5)
Platelets: 271 10*3/uL (ref 140–400)
RBC: 4.99 10*6/uL (ref 3.80–5.10)
RDW: 12 % (ref 11.0–15.0)
WBC: 6.6 10*3/uL (ref 3.8–10.8)

## 2018-12-11 LAB — TSH: TSH: 0.9 mIU/L (ref 0.40–4.50)

## 2018-12-11 LAB — VITAMIN D 25 HYDROXY (VIT D DEFICIENCY, FRACTURES): Vit D, 25-Hydroxy: 78 ng/mL (ref 30–100)

## 2018-12-11 LAB — T3, FREE: T3, Free: 3.4 pg/mL (ref 2.3–4.2)

## 2018-12-11 LAB — LIPID PANEL
Cholesterol: 244 mg/dL — ABNORMAL HIGH (ref <200)
HDL: 74 mg/dL (ref >=50)
LDL Cholesterol (Calc): 140 mg/dL (calc) — ABNORMAL HIGH
Non-HDL Cholesterol (Calc): 170 mg/dL (calc) — ABNORMAL HIGH (ref <130)
Total CHOL/HDL Ratio: 3.3 (calc) (ref <5.0)
Triglycerides: 165 mg/dL — ABNORMAL HIGH (ref <150)

## 2018-12-11 LAB — ESTRADIOL: Estradiol: 33 pg/mL

## 2018-12-11 LAB — HEPATITIS C ANTIBODY
Hepatitis C Ab: NONREACTIVE
SIGNAL TO CUT-OFF: 0.02 (ref <1.00)

## 2018-12-11 LAB — PROGESTERONE: Progesterone: 10.4 ng/mL

## 2018-12-14 ENCOUNTER — Other Ambulatory Visit (INDEPENDENT_AMBULATORY_CARE_PROVIDER_SITE_OTHER): Payer: Self-pay | Admitting: Internal Medicine

## 2018-12-14 MED ORDER — ESTRADIOL 2 MG PO TABS: 2.0000 mg | ORAL_TABLET | Freq: Every day | ORAL | 3 refills | Status: DC

## 2019-01-05 ENCOUNTER — Ambulatory Visit (HOSPITAL_COMMUNITY)
Admission: RE | Admit: 2019-01-05 | Discharge: 2019-01-05 | Disposition: A | Discharge status: Home / Self Care | Payer: BC Managed Care – PPO | Source: Ambulatory Visit | Attending: Internal Medicine | Admitting: Internal Medicine

## 2019-01-05 ENCOUNTER — Other Ambulatory Visit: Payer: Self-pay

## 2019-01-05 DIAGNOSIS — Z1231 Encounter for screening mammogram for malignant neoplasm of breast: Secondary | ICD-10-CM

## 2019-02-03 ENCOUNTER — Other Ambulatory Visit (INDEPENDENT_AMBULATORY_CARE_PROVIDER_SITE_OTHER): Payer: Self-pay | Admitting: Internal Medicine

## 2019-02-08 ENCOUNTER — Encounter (INDEPENDENT_AMBULATORY_CARE_PROVIDER_SITE_OTHER): Payer: Self-pay | Admitting: Internal Medicine

## 2019-02-10 ENCOUNTER — Telehealth (INDEPENDENT_AMBULATORY_CARE_PROVIDER_SITE_OTHER): Payer: Self-pay

## 2019-02-10 ENCOUNTER — Encounter (INDEPENDENT_AMBULATORY_CARE_PROVIDER_SITE_OTHER): Payer: Self-pay | Admitting: Internal Medicine

## 2019-02-10 ENCOUNTER — Other Ambulatory Visit (INDEPENDENT_AMBULATORY_CARE_PROVIDER_SITE_OTHER): Payer: Self-pay | Admitting: Internal Medicine

## 2019-02-10 NOTE — Telephone Encounter (Signed)
I have sent a message to the [facility] Audit Department to review the charges for DOS 12/10/18.  I will wait for the Audit report the verify the charges for this DOS.

## 2019-02-11 NOTE — Telephone Encounter (Signed)
lmom 

## 2019-02-12 ENCOUNTER — Ambulatory Visit (INDEPENDENT_AMBULATORY_CARE_PROVIDER_SITE_OTHER): Payer: BC Managed Care – PPO | Admitting: Internal Medicine

## 2019-02-20 ENCOUNTER — Other Ambulatory Visit (HOSPITAL_COMMUNITY)
Admission: RE | Admit: 2019-02-20 | Discharge: 2019-02-20 | Disposition: A | Discharge status: Home / Self Care | Payer: 59 | Source: Ambulatory Visit | Attending: Adult Health | Admitting: Adult Health

## 2019-02-20 ENCOUNTER — Ambulatory Visit (INDEPENDENT_AMBULATORY_CARE_PROVIDER_SITE_OTHER): Payer: 59 | Admitting: Adult Health

## 2019-02-20 ENCOUNTER — Other Ambulatory Visit: Payer: Self-pay | Admitting: Adult Health

## 2019-02-20 ENCOUNTER — Other Ambulatory Visit: Payer: Self-pay

## 2019-02-20 ENCOUNTER — Encounter: Payer: Self-pay | Admitting: Adult Health

## 2019-02-20 VITALS — BP 123/78 | HR 64 | Ht 68.0 in | Wt 155.0 lb

## 2019-02-20 DIAGNOSIS — Z1212 Encounter for screening for malignant neoplasm of rectum: Secondary | ICD-10-CM

## 2019-02-20 DIAGNOSIS — N841 Polyp of cervix uteri: Secondary | ICD-10-CM | POA: Insufficient documentation

## 2019-02-20 DIAGNOSIS — Z1211 Encounter for screening for malignant neoplasm of colon: Secondary | ICD-10-CM | POA: Insufficient documentation

## 2019-02-20 DIAGNOSIS — Z01419 Encounter for gynecological examination (general) (routine) without abnormal findings: Secondary | ICD-10-CM | POA: Insufficient documentation

## 2019-02-20 LAB — HEMOCCULT GUIAC POC 1CARD (OFFICE): Fecal Occult Blood, POC: NEGATIVE

## 2019-02-20 NOTE — Progress Notes (Signed)
Patient ID: Amy Kelley, female   DOB: [birth year], 64 y.o.   MRN: [id number] History of Present Illness:  Amy Kelley is a 64 year old white female, married, PM in for a well woman gyn exam and pap.Daughter is getting married 03/14/19. PCP is Dr Anastasio Champion.  Current Medications, Allergies, Past Medical History, Past Surgical History, Family History and Social History were reviewed in [employer] record.     Review of Systems: Patient denies any headaches, hearing loss, fatigue, blurred vision, shortness of breath, chest pain, abdominal pain, problems with bowel movements, urination, or intercourse. No joint pain or mood swings.No vaginal bleeding is on HRT with PCP, no hot flashes.    Physical Exam:BP 123/78 (BP Location: Left Arm, Patient Position: Sitting, Cuff Size: Normal)   Pulse 64   Ht 5\' 8"  (1.727 m)   Wt 155 lb (70.3 kg)   BMI 23.57 kg/m  General:  Well developed, well nourished, no acute distress Skin:  Warm and dry Neck:  Midline trachea, normal thyroid, good ROM, no lymphadenopathy, no carotid bruits heard Lungs; Clear to auscultation bilaterally Breast:  No dominant palpable mass, retraction, or nipple discharge Cardiovascular: Regular rate and rhythm Abdomen:  Soft, non tender, no hepatosplenomegaly Pelvic:  External genitalia is normal in appearance, no lesions.  The vagina is normal in appearance. Urethra has no lesions or masses. The cervix is bulbous, has cervical polyp at os, pap with high risk HPV 16/18 genotyping performed, then with forceps grasped polyp and twisted polyp off..  Uterus is felt to be normal size, shape, and contour.  No adnexal masses or tenderness noted.Bladder is non tender, no masses felt. Rectal: Good sphincter tone, no polyps, or hemorrhoids felt.  Hemoccult negative. Extremities/musculoskeletal:  No swelling or varicosities noted, no clubbing or cyanosis Psych:  No mood changes, alert and cooperative,seems happy Fall risk is  low PHQ 2 score is 0 Examination chaperoned by Estill Bamberg Rash LPN  Impression and Plan:  1. Encounter for gynecological examination with Papanicolaou smear of cervix Pap sent Physical in 1 year, if desired or can have with PCP Pap in 3 if normal Mammogram yearly   2. Screening for colorectal cancer Colonoscopy per GI  3. Cervical polyp Polyp sent to pathology

## 2019-02-23 LAB — CYTOLOGY - PAP
Comment: NEGATIVE
Diagnosis: NEGATIVE
High risk HPV: NEGATIVE

## 2019-02-23 LAB — CERVICOVAGINAL ANCILLARY ONLY: HPV: NOT DETECTED

## 2019-03-02 ENCOUNTER — Other Ambulatory Visit (INDEPENDENT_AMBULATORY_CARE_PROVIDER_SITE_OTHER): Payer: Self-pay | Admitting: Internal Medicine

## 2019-03-18 ENCOUNTER — Ambulatory Visit (INDEPENDENT_AMBULATORY_CARE_PROVIDER_SITE_OTHER): Payer: 59 | Admitting: Internal Medicine

## 2019-04-08 ENCOUNTER — Ambulatory Visit (INDEPENDENT_AMBULATORY_CARE_PROVIDER_SITE_OTHER): Payer: 59 | Admitting: Internal Medicine

## 2019-04-08 ENCOUNTER — Other Ambulatory Visit: Payer: Self-pay

## 2019-04-08 ENCOUNTER — Encounter (INDEPENDENT_AMBULATORY_CARE_PROVIDER_SITE_OTHER): Payer: Self-pay | Admitting: Internal Medicine

## 2019-04-08 VITALS — BP 122/80 | HR 88 | Temp 98.0°F | Resp 18 | Ht 68.0 in | Wt 155.0 lb

## 2019-04-08 DIAGNOSIS — E559 Vitamin D deficiency, unspecified: Secondary | ICD-10-CM

## 2019-04-08 DIAGNOSIS — F5101 Primary insomnia: Secondary | ICD-10-CM | POA: Diagnosis not present

## 2019-04-08 DIAGNOSIS — E2839 Other primary ovarian failure: Secondary | ICD-10-CM

## 2019-04-08 DIAGNOSIS — M810 Age-related osteoporosis without current pathological fracture: Secondary | ICD-10-CM | POA: Diagnosis not present

## 2019-04-08 NOTE — Progress Notes (Signed)
Metrics: Intervention Frequency ACO  Documented Smoking Status Yearly  Screened one or more times in 24 months  Cessation Counseling or  Active cessation medication Past 24 months  Past 24 months   Guideline developer: UpToDate (See UpToDate for funding source) Date Released: 2014       Wellness Office Visit  Subjective:  Patient ID: Amy Kelley, female    DOB: 12-Jan-1956  Age: 64 y.o. MRN: [id number]  CC: This lady comes in for management of postmenopausal symptoms and osteopenia, vitamin D deficiency. HPI  Her most recent bone density scan did show osteopenia as opposed to osteoporosis which is what I think she had shown previously. On the last visit, I did increase estradiol and she has tolerated this dose. Past Medical History:  Diagnosis Date  . Arthritis   . Cancer ([place])    skin cancer   . Headache    migraines  . Hematuria 06/30/2013  . HLD (hyperlipidemia) 12/10/2018  . Osteoporosis 12/10/2018  . PMB (postmenopausal bleeding) 05/02/2015  . Vaginal atrophy 06/30/2013  . Vitamin D deficiency disease 12/10/2018      Family History  Problem Relation Age of Onset  . Alzheimer's disease Father   . Cancer Sister        ovarian, pancreatic  . Epilepsy Sister   . Heart attack Maternal Grandfather   . 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   Social History   Tobacco Use  . Smoking status: Never Smoker  . Smokeless tobacco: Never Used  Substance Use Topics  . Alcohol use: No    Current Meds  Medication Sig  . calcium-vitamin D 250-100 MG-UNIT tablet Take 1 tablet by mouth daily.  . Cholecalciferol (VITAMIN D-3) 125 MCG (5000 UT) TABS Take 10,000 Units by mouth daily at 12 noon.  Marland Kitchen estradiol (ESTRACE) 2 MG tablet Take 1 tablet (2 mg total) by mouth daily.  . Multiple Vitamin (MULTIVITAMIN WITH MINERALS) TABS tablet Take 1 tablet by mouth daily.  . NP THYROID 60 MG tablet Take 1  tablet by mouth once daily  . progesterone (PROMETRIUM) 200 MG capsule TAKE 1 CAPSULE BY MOUTH ONCE DAILY AT NIGHT      Objective:   Today's Vitals: BP 122/80 (BP Location: Left Arm, Patient Position: Sitting, Cuff Size: Normal)   Pulse 88   Temp 98 F (36.7 C) (Temporal)   Resp 18   Ht 5\' 8"  (1.727 m)   Wt 155 lb (70.3 kg)   SpO2 98% Comment: wearing mask.  BMI 23.57 kg/m  Vitals with BMI 04/08/2019 02/20/2019 12/10/2018  Height 5\' 8"  5\' 8"  5\' 8"   Weight 155 lbs 155 lbs 153 lbs  BMI 23.57 123456 [id number]  Systolic [id number]  Diastolic 80 78 72  Pulse 88 64 73     Physical Exam  She looks systemically well.  No new physical findings.     Assessment   1. Vitamin D deficiency disease   2. Age-related osteoporosis without current pathological fracture   3. Primary ovarian failure   4. Primary insomnia       Tests ordered Orders Placed This Encounter  Procedures  . Estradiol  . Progesterone     Plan: 1. Blood work is ordered above to make sure her estradiol and progesterone levels are in a good range. 2. We talked again about testosterone therapy that she wanted to talk about.  I explained benefits  and also answered all her questions.  She will think about it more. 3. I also discussed nutrition with the concept of intermittent fasting and a plant-based diet mostly.  I gave her a diet sheet. 4. Further recommendations will depend on blood results and I will see her for follow-up in about 4 months time. 5. I spent 30 minutes with the patient today discussing all of the above.   No orders of the defined types were placed in this encounter.   Doree Albee, MD

## 2019-04-08 NOTE — Patient Instructions (Signed)
[facility] Dietary Recommendations for Weight Loss What to Avoid . Avoid added sugars o Often added sugar can be found in processed foods such as many condiments, dry cereals, cakes, cookies, chips, crisps, crackers, candies, sweetened drinks, etc.  o Read labels and AVOID/DECREASE use of foods with the following in their ingredient list: Sugar, fructose, high fructose corn syrup, sucrose, glucose, maltose, dextrose, molasses, cane sugar, brown sugar, any type of syrup, agave nectar, etc.   . Avoid snacking in between meals . Avoid foods made with flour o If you are going to eat food made with flour, choose those made with whole-grains; and, minimize your consumption as much as is tolerable . Avoid processed foods o These foods are generally stocked in the middle of the grocery store. Focus on shopping on the perimeter of the grocery.  . Avoid Meat  o We recommend following a plant-based diet at  [facility]. Thus, we recommend avoiding meat as a general rule. Consider eating beans, legumes, eggs, and/or dairy products for regular protein sources o If you plan on eating meat limit to 4 ounces of meat at a time and choose lean options such as Fish, chicken, turkey. Avoid red meat intake such as pork and/or steak What to Include . Vegetables o GREEN LEAFY VEGETABLES: Kale, spinach, mustard greens, collard greens, cabbage, broccoli, etc. o OTHER: Asparagus, cauliflower, eggplant, carrots, peas, Brussel sprouts, tomatoes, bell peppers, zucchini, beets, cucumbers, etc. . Grains, seeds, and legumes o Beans: kidney beans, black eyed peas, garbanzo beans, black beans, pinto beans, etc. o Whole, unrefined grains: brown rice, barley, bulgur, oatmeal, etc. . Healthy fats  o Avoid highly processed fats such as vegetable oil o Examples of healthy fats: avocado, olives, virgin olive oil, dark chocolate (?72% Cocoa), nuts (peanuts, almonds, walnuts, cashews, pecans, etc.) . None to Low  Intake of Animal Sources of Protein o Meat sources: chicken, turkey, salmon, tuna. Limit to 4 ounces of meat at one time. o Consider limiting dairy sources, but when choosing dairy focus on: PLAIN Greek yogurt, cottage cheese, high-protein milk . Fruit o Choose berries  When to Eat . Intermittent Fasting: o Choosing not to eat for a specific time period, but DO FOCUS ON HYDRATION when fasting o Multiple Techniques: - Time Restricted Eating: eat 3 meals in a day, each meal lasting no more than 60 minutes, no snacks between meals - 16-18 hour fast: fast for 16 to 18 hours up to 7 days a week. Often suggested to start with 2-3 nonconsecutive days per week.  . Remember the time you sleep is counted as fasting.  . Examples of eating schedule: Fast from 7:00pm-11:00am. Eat between 11:00am-7:00pm.  - 24-hour fast: fast for 24 hours up to every other day. Often suggested to start with 1 day per week . Remember the time you sleep is counted as fasting . Examples of eating schedule:  o Eating day: eat 2-3 meals on your eating day. If doing 2 meals, each meal should last no more than 90 minutes. If doing 3 meals, each meal should last no more than 60 minutes. Finish last meal by 7:00pm. o Fasting day: Fast until 7:00pm.  o IF YOU FEEL UNWELL FOR ANY REASON/IN ANY WAY WHEN FASTING, STOP FASTING BY EATING A NUTRITIOUS SNACK OR LIGHT MEAL o ALWAYS FOCUS ON HYDRATION DURING FASTS - Acceptable Hydration sources: water, broths, tea/coffee (black tea/coffee is best but using a small amount of whole-fat dairy products in coffee/tea is acceptable).  -   Poor Hydration Sources: anything with sugar or artificial sweeteners added to it  These recommendations have been developed for patients that are actively receiving medical care from either Dr.  or Sarah Gray, DNP, NP-C at  [facility]. These recommendations are developed for patients with specific medical conditions and are not meant to be  distributed or used by others that are not actively receiving care from either provider listed above at  [facility]. It is not appropriate to participate in the above eating plans without proper medical supervision.   Reference: Fung, J. The obesity code. [place]: [place]; 2016.   

## 2019-04-09 LAB — ESTRADIOL: Estradiol: 82 pg/mL

## 2019-04-09 LAB — PROGESTERONE: Progesterone: 15.6 ng/mL

## 2019-05-06 ENCOUNTER — Other Ambulatory Visit (INDEPENDENT_AMBULATORY_CARE_PROVIDER_SITE_OTHER): Payer: Self-pay | Admitting: Internal Medicine

## 2019-06-01 ENCOUNTER — Other Ambulatory Visit (INDEPENDENT_AMBULATORY_CARE_PROVIDER_SITE_OTHER): Payer: Self-pay | Admitting: Internal Medicine

## 2019-08-06 ENCOUNTER — Other Ambulatory Visit (INDEPENDENT_AMBULATORY_CARE_PROVIDER_SITE_OTHER): Payer: Self-pay | Admitting: Internal Medicine

## 2019-08-27 ENCOUNTER — Other Ambulatory Visit (INDEPENDENT_AMBULATORY_CARE_PROVIDER_SITE_OTHER): Payer: Self-pay | Admitting: Internal Medicine

## 2019-08-31 ENCOUNTER — Other Ambulatory Visit (INDEPENDENT_AMBULATORY_CARE_PROVIDER_SITE_OTHER): Payer: Self-pay | Admitting: Internal Medicine

## 2019-08-31 ENCOUNTER — Telehealth (INDEPENDENT_AMBULATORY_CARE_PROVIDER_SITE_OTHER): Payer: Self-pay

## 2019-08-31 MED ORDER — FIRST-TESTOSTERONE MC 2 % TD CREA: 5.0000 mg | TOPICAL_CREAM | Freq: Every day | TRANSDERMAL | 0 refills | Status: DC

## 2019-08-31 NOTE — Telephone Encounter (Signed)
Pt stated a concern of a small rash. It is in the upper area where she apply cream. Wanted to know is one of the side effects? Sh said you may send message on mychart.

## 2019-08-31 NOTE — Telephone Encounter (Signed)
Please let her know that this probably will improve but if the rash persists then I will need to see it.

## 2019-08-31 NOTE — Telephone Encounter (Signed)
Please let the patient know that I refilled his testosterone cream at [facility] today.  They should call her when it is ready.

## 2019-09-01 NOTE — Telephone Encounter (Signed)
Pt was agreeable on monition area and if it gets worse. She will call or request a appt to be seen.

## 2019-09-11 ENCOUNTER — Other Ambulatory Visit (INDEPENDENT_AMBULATORY_CARE_PROVIDER_SITE_OTHER): Payer: Self-pay | Admitting: Internal Medicine

## 2019-09-25 DIAGNOSIS — I83813 Varicose veins of bilateral lower extremities with pain: Secondary | ICD-10-CM | POA: Diagnosis not present

## 2019-09-30 ENCOUNTER — Other Ambulatory Visit: Payer: Self-pay

## 2019-09-30 ENCOUNTER — Ambulatory Visit (INDEPENDENT_AMBULATORY_CARE_PROVIDER_SITE_OTHER): Payer: PPO | Admitting: Internal Medicine

## 2019-09-30 ENCOUNTER — Encounter (INDEPENDENT_AMBULATORY_CARE_PROVIDER_SITE_OTHER): Payer: Self-pay | Admitting: Internal Medicine

## 2019-09-30 VITALS — BP 120/80 | Temp 97.3°F | Ht 68.0 in | Wt 157.4 lb

## 2019-09-30 DIAGNOSIS — E2839 Other primary ovarian failure: Secondary | ICD-10-CM

## 2019-09-30 DIAGNOSIS — E782 Mixed hyperlipidemia: Secondary | ICD-10-CM | POA: Diagnosis not present

## 2019-09-30 NOTE — Progress Notes (Signed)
Metrics: Intervention Frequency ACO  Documented Smoking Status Yearly  Screened one or more times in 24 months  Cessation Counseling or  Active cessation medication Past 24 months  Past 24 months   Guideline developer: UpToDate (See UpToDate for funding source) Date Released: 2014       Wellness Office Visit  Subjective:  Patient ID: Amy Kelley, female    DOB: 1955/01/26  Age: 64 y.o. MRN: [id number]  CC: This lady comes in for follow-up of biological hormone therapy, thyroid deficiency symptoms, vitamin D deficiency. HPI  Today, she is complaining of a rash that has been present previously.  Looking back at mine notes, I see that she saw me for the same problem about 1 year ago and it appears that the rash has not completely resolved.  She has been taking bioidentical hormones of estradiol and progesterone during this time and also desiccated NP thyroid.  Testosterone cream is a new addition and she also complains of some bumps on her face. Past Medical History:  Diagnosis Date  . Arthritis   . Cancer ([place])    skin cancer   . Headache    migraines  . Hematuria 06/30/2013  . HLD (hyperlipidemia) 12/10/2018  . Osteoporosis 12/10/2018  . PMB (postmenopausal bleeding) 05/02/2015  . Vaginal atrophy 06/30/2013  . Vitamin D deficiency disease 12/10/2018   Past Surgical History:  Procedure Laterality Date  . CESAREAN SECTION    . COLONOSCOPY N/A 10/15/2013   Procedure: COLONOSCOPY;  Surgeon: Rogene Houston, MD;  [facility];  Service: Endoscopy;  Laterality: N/A;  930  . PARS PLANA VITRECTOMY Right 05/26/2016   Procedure: PARS PLANA VITRECTOMY WITH ENDO LASER, SF6 INJECTION;  Surgeon: Jalene Mullet, MD;  Location: [place];  Service: Ophthalmology;  Laterality: Right;  . skin cancer removal     on left leg about 5 years ago or more.     Family History  Problem Relation Age of Onset  . Alzheimer's disease Father   . Cancer Sister        ovarian, pancreatic  . Epilepsy  Sister   . Heart attack Maternal Grandfather   . 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   Social History   Tobacco Use  . Smoking status: Never Smoker  . Smokeless tobacco: Never Used  Substance Use Topics  . Alcohol use: No    Current Meds  Medication Sig  . calcium-vitamin D 250-100 MG-UNIT tablet Take 1 tablet by mouth daily.  . Cholecalciferol (VITAMIN D-3) 125 MCG (5000 UT) TABS Take 10,000 Units by mouth daily at 12 noon.  Marland Kitchen estradiol (ESTRACE) 2 MG tablet Take 1 tablet by mouth once daily  . Multiple Vitamin (MULTIVITAMIN WITH MINERALS) TABS tablet Take 1 tablet by mouth daily.  . NP THYROID 60 MG tablet Take 1 tablet by mouth once daily  . progesterone (PROMETRIUM) 200 MG capsule TAKE 1 CAPSULE BY MOUTH ONCE DAILY AT NIGHT  . Testosterone Propionate (FIRST-TESTOSTERONE MC) 2 % CREA Place 5 mg onto the skin daily.      Depression screen [facility] 2/9 02/20/2019  Decreased Interest 0  Down, Depressed, Hopeless 0  PHQ - 2 Score 0     Objective:   Today's Vitals: BP 120/80 (BP Location: Left Arm, Patient Position: Sitting, Cuff Size: Normal)   Temp (!) 97.3 F (36.3 C) (Temporal)   Ht 5\' 8"  (1.727 m)  Wt 157 lb 6.4 oz (71.4 kg)   BMI 23.93 kg/m  Vitals with BMI 09/30/2019 04/08/2019 02/20/2019  Height 5\' 8"  5\' 8"  5\' 8"   Weight 157 lbs 6 oz 155 lbs 155 lbs  BMI 23.94 97.84 78.41  Systolic 282 081 388  Diastolic 80 80 78  Pulse - 88 64     Physical Exam  I examined the rash which is primarily on her face, neck.  There are some areas where I think she may have acne but I am not certain.     Assessment   1. Primary ovarian failure   2. Mixed hyperlipidemia       Tests ordered Orders Placed This Encounter  Procedures  . Testos,Total,Free and SHBG (Female)  . Lipid panel     Plan: 1. After shared decision making, we agreed that she should stop taking estradiol,  progesterone, NP thyroid and testosterone for the time being and see if the rash resolves.  I will see her in about a month's time and reevaluate and if her rash has resolved completely, we may start restarting one medication at a time to see what the offending medication might have been. 2. I will also check a lipid panel as she has not had this done for a long time. 3. Follow-up in 1 month.   No orders of the defined types were placed in this encounter.   Doree Albee, MD

## 2019-10-03 LAB — TESTOS,TOTAL,FREE AND SHBG (FEMALE)
Free Testosterone: 1.1 pg/mL (ref 0.1–6.4)
Sex Hormone Binding: 131 nmol/L — ABNORMAL HIGH (ref 14–73)
Testosterone, Total, LC-MS-MS: 16 ng/dL (ref 2–45)

## 2019-10-03 LAB — LIPID PANEL
Cholesterol: 229 mg/dL — ABNORMAL HIGH (ref <200)
HDL: 65 mg/dL (ref >=50)
LDL Cholesterol (Calc): 139 mg/dL (calc) — ABNORMAL HIGH
Non-HDL Cholesterol (Calc): 164 mg/dL (calc) — ABNORMAL HIGH (ref <130)
Total CHOL/HDL Ratio: 3.5 (calc) (ref <5.0)
Triglycerides: 128 mg/dL (ref <150)

## 2019-10-12 ENCOUNTER — Ambulatory Visit (INDEPENDENT_AMBULATORY_CARE_PROVIDER_SITE_OTHER): Payer: 59 | Admitting: Internal Medicine

## 2019-10-15 DIAGNOSIS — L308 Other specified dermatitis: Secondary | ICD-10-CM | POA: Diagnosis not present

## 2019-10-20 DIAGNOSIS — I8311 Varicose veins of right lower extremity with inflammation: Secondary | ICD-10-CM | POA: Diagnosis not present

## 2019-10-20 DIAGNOSIS — M79604 Pain in right leg: Secondary | ICD-10-CM | POA: Diagnosis not present

## 2019-10-20 DIAGNOSIS — M79605 Pain in left leg: Secondary | ICD-10-CM | POA: Diagnosis not present

## 2019-10-20 DIAGNOSIS — I8312 Varicose veins of left lower extremity with inflammation: Secondary | ICD-10-CM | POA: Diagnosis not present

## 2019-10-20 DIAGNOSIS — I87323 Chronic venous hypertension (idiopathic) with inflammation of bilateral lower extremity: Secondary | ICD-10-CM | POA: Diagnosis not present

## 2019-10-21 DIAGNOSIS — I8312 Varicose veins of left lower extremity with inflammation: Secondary | ICD-10-CM | POA: Diagnosis not present

## 2019-10-21 DIAGNOSIS — M79604 Pain in right leg: Secondary | ICD-10-CM | POA: Diagnosis not present

## 2019-10-21 DIAGNOSIS — I87323 Chronic venous hypertension (idiopathic) with inflammation of bilateral lower extremity: Secondary | ICD-10-CM | POA: Diagnosis not present

## 2019-10-21 DIAGNOSIS — M79605 Pain in left leg: Secondary | ICD-10-CM | POA: Diagnosis not present

## 2019-10-21 DIAGNOSIS — I8311 Varicose veins of right lower extremity with inflammation: Secondary | ICD-10-CM | POA: Diagnosis not present

## 2019-11-04 DIAGNOSIS — H26491 Other secondary cataract, right eye: Secondary | ICD-10-CM | POA: Diagnosis not present

## 2019-11-04 DIAGNOSIS — D492 Neoplasm of unspecified behavior of bone, soft tissue, and skin: Secondary | ICD-10-CM | POA: Diagnosis not present

## 2019-11-04 DIAGNOSIS — H33051 Total retinal detachment, right eye: Secondary | ICD-10-CM | POA: Diagnosis not present

## 2019-11-04 DIAGNOSIS — Z961 Presence of intraocular lens: Secondary | ICD-10-CM | POA: Diagnosis not present

## 2019-11-11 ENCOUNTER — Ambulatory Visit (INDEPENDENT_AMBULATORY_CARE_PROVIDER_SITE_OTHER): Payer: PPO | Admitting: Internal Medicine

## 2019-11-25 ENCOUNTER — Ambulatory Visit (HOSPITAL_COMMUNITY)
Admission: RE | Admit: 2019-11-25 | Discharge: 2019-11-25 | Disposition: A | Discharge status: Home / Self Care | Payer: PPO | Source: Ambulatory Visit | Attending: Nurse Practitioner | Admitting: Nurse Practitioner

## 2019-11-25 ENCOUNTER — Ambulatory Visit (INDEPENDENT_AMBULATORY_CARE_PROVIDER_SITE_OTHER): Payer: PPO | Admitting: Nurse Practitioner

## 2019-11-25 ENCOUNTER — Other Ambulatory Visit: Payer: Self-pay

## 2019-11-25 ENCOUNTER — Encounter (INDEPENDENT_AMBULATORY_CARE_PROVIDER_SITE_OTHER): Payer: Self-pay | Admitting: Nurse Practitioner

## 2019-11-25 VITALS — BP 110/76 | HR 87 | Temp 97.9°F | Resp 18 | Ht 68.0 in | Wt 153.0 lb

## 2019-11-25 DIAGNOSIS — G4700 Insomnia, unspecified: Secondary | ICD-10-CM

## 2019-11-25 DIAGNOSIS — Z23 Encounter for immunization: Secondary | ICD-10-CM | POA: Diagnosis not present

## 2019-11-25 DIAGNOSIS — M542 Cervicalgia: Secondary | ICD-10-CM | POA: Diagnosis not present

## 2019-11-25 DIAGNOSIS — R4189 Other symptoms and signs involving cognitive functions and awareness: Secondary | ICD-10-CM
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
## 2019-11-25 NOTE — Patient Instructions (Signed)

## 2019-11-26 ENCOUNTER — Other Ambulatory Visit (INDEPENDENT_AMBULATORY_CARE_PROVIDER_SITE_OTHER): Payer: Self-pay | Admitting: Nurse Practitioner

## 2019-11-26 DIAGNOSIS — E875 Hyperkalemia: Secondary | ICD-10-CM

## 2019-11-26 LAB — COMPLETE METABOLIC PANEL WITH GFR
AG Ratio: 1.7 (calc) (ref 1.0–2.5)
ALT: 18 U/L (ref 6–29)
AST: 25 U/L (ref 10–35)
Albumin: 4.5 g/dL (ref 3.6–5.1)
Alkaline phosphatase (APISO): 90 U/L (ref 37–153)
BUN: 14 mg/dL (ref 7–25)
CO2: 31 mmol/L (ref 20–32)
Calcium: 10.5 mg/dL — ABNORMAL HIGH (ref 8.6–10.4)
Chloride: 104 mmol/L (ref 98–110)
Creat: 0.77 mg/dL (ref 0.50–0.99)
GFR, Est African American: 94 mL/min/{1.73_m2} (ref >=60)
GFR, Est Non African American: 81 mL/min/{1.73_m2} (ref >=60)
Globulin: 2.6 g/dL (calc) (ref 1.9–3.7)
Glucose, Bld: 99 mg/dL (ref 65–99)
Potassium: 5.9 mmol/L — ABNORMAL HIGH (ref 3.5–5.3)
Sodium: 142 mmol/L (ref 135–146)
Total Bilirubin: 0.4 mg/dL (ref 0.2–1.2)
Total Protein: 7.1 g/dL (ref 6.1–8.1)

## 2019-11-30 ENCOUNTER — Other Ambulatory Visit (INDEPENDENT_AMBULATORY_CARE_PROVIDER_SITE_OTHER): Payer: Self-pay | Admitting: Nurse Practitioner

## 2019-11-30 DIAGNOSIS — M542 Cervicalgia: Secondary | ICD-10-CM

## 2019-11-30 NOTE — Progress Notes (Signed)
Order for physical therapy for treatment of neck pain

## 2019-12-03 DIAGNOSIS — I87323 Chronic venous hypertension (idiopathic) with inflammation of bilateral lower extremity: Secondary | ICD-10-CM | POA: Diagnosis not present

## 2019-12-07 ENCOUNTER — Other Ambulatory Visit (INDEPENDENT_AMBULATORY_CARE_PROVIDER_SITE_OTHER): Payer: PPO

## 2019-12-07 DIAGNOSIS — E875 Hyperkalemia: Secondary | ICD-10-CM | POA: Diagnosis not present

## 2019-12-08 LAB — COMPLETE METABOLIC PANEL WITH GFR
AG Ratio: 1.7 (calc) (ref 1.0–2.5)
ALT: 23 U/L (ref 6–29)
AST: 28 U/L (ref 10–35)
Albumin: 4.3 g/dL (ref 3.6–5.1)
Alkaline phosphatase (APISO): 76 U/L (ref 37–153)
BUN: 11 mg/dL (ref 7–25)
CO2: 31 mmol/L (ref 20–32)
Calcium: 10 mg/dL (ref 8.6–10.4)
Chloride: 103 mmol/L (ref 98–110)
Creat: 0.78 mg/dL (ref 0.50–0.99)
GFR, Est African American: 92 mL/min/{1.73_m2} (ref >=60)
GFR, Est Non African American: 80 mL/min/{1.73_m2} (ref >=60)
Globulin: 2.5 g/dL (calc) (ref 1.9–3.7)
Glucose, Bld: 96 mg/dL (ref 65–99)
Potassium: 5.1 mmol/L (ref 3.5–5.3)
Sodium: 140 mmol/L (ref 135–146)
Total Bilirubin: 0.4 mg/dL (ref 0.2–1.2)
Total Protein: 6.8 g/dL (ref 6.1–8.1)

## 2019-12-08 LAB — PTH, INTACT AND CALCIUM
Calcium: 10 mg/dL (ref 8.6–10.4)
PTH: 30 pg/mL (ref 14–64)

## 2019-12-15 ENCOUNTER — Other Ambulatory Visit (INDEPENDENT_AMBULATORY_CARE_PROVIDER_SITE_OTHER): Payer: Self-pay | Admitting: Internal Medicine

## 2019-12-15 ENCOUNTER — Encounter (INDEPENDENT_AMBULATORY_CARE_PROVIDER_SITE_OTHER): Payer: Self-pay | Admitting: Nurse Practitioner

## 2019-12-15 DIAGNOSIS — M542 Cervicalgia: Secondary | ICD-10-CM

## 2019-12-23 ENCOUNTER — Encounter: Payer: Self-pay | Admitting: Physical Medicine & Rehabilitation

## 2019-12-31 ENCOUNTER — Ambulatory Visit (INDEPENDENT_AMBULATORY_CARE_PROVIDER_SITE_OTHER): Payer: PPO | Admitting: Internal Medicine

## 2019-12-31 ENCOUNTER — Other Ambulatory Visit: Payer: Self-pay

## 2019-12-31 ENCOUNTER — Encounter (INDEPENDENT_AMBULATORY_CARE_PROVIDER_SITE_OTHER): Payer: Self-pay | Admitting: Internal Medicine

## 2019-12-31 VITALS — BP 130/72 | HR 69 | Temp 98.2°F | Resp 18 | Ht 68.0 in | Wt 153.0 lb

## 2019-12-31 DIAGNOSIS — E559 Vitamin D deficiency, unspecified: Secondary | ICD-10-CM | POA: Diagnosis not present

## 2019-12-31 DIAGNOSIS — E782 Mixed hyperlipidemia: Secondary | ICD-10-CM

## 2019-12-31 DIAGNOSIS — M810 Age-related osteoporosis without current pathological fracture: Secondary | ICD-10-CM | POA: Diagnosis not present

## 2019-12-31 NOTE — Progress Notes (Signed)
Metrics: Intervention Frequency ACO  Documented Smoking Status Yearly  Screened one or more times in 24 months  Cessation Counseling or  Active cessation medication Past 24 months  Past 24 months   Guideline developer: UpToDate (See UpToDate for funding source) Date Released: 2014       Wellness Office Visit  Subjective:  Patient ID: Amy Kelley, female    DOB: 1956-01-01  Age: 64 y.o. MRN: [id number]  CC: This lady comes in for follow-up of vitamin D deficiency, hyperlipidemia and osteoporosis HPI  She was previously on bioidentical hormones but she has discontinued all these and did not feel comfortable taking them.  She has a history of hyperlipidemia.  She has a history of osteoporosis. Past Medical History:  Diagnosis Date  . Arthritis   . Cancer ([place])    skin cancer   . Headache    migraines  . Hematuria 06/30/2013  . HLD (hyperlipidemia) 12/10/2018  . Osteoporosis 12/10/2018  . PMB (postmenopausal bleeding) 05/02/2015  . Vaginal atrophy 06/30/2013  . Vitamin D deficiency disease 12/10/2018   Past Surgical History:  Procedure Laterality Date  . CESAREAN SECTION    . COLONOSCOPY N/A 10/15/2013   Procedure: COLONOSCOPY;  Surgeon: Rogene Houston, MD;  [facility];  Service: Endoscopy;  Laterality: N/A;  930  . PARS PLANA VITRECTOMY Right 05/26/2016   Procedure: PARS PLANA VITRECTOMY WITH ENDO LASER, SF6 INJECTION;  Surgeon: Jalene Mullet, MD;  Location: [facility];  Service: Ophthalmology;  Laterality: Right;  . skin cancer removal     on left leg about 5 years ago or more.     Family History  Problem Relation Age of Onset  . Alzheimer's disease Father   . Cancer Sister        ovarian, pancreatic  . Epilepsy Sister   . Heart attack Maternal Grandfather   . 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   Social History   Tobacco Use  . Smoking status: Never Smoker  .  Smokeless tobacco: Never Used  Substance Use Topics  . Alcohol use: No    Current Meds  Medication Sig  . calcium-vitamin D 250-100 MG-UNIT tablet Take 1 tablet by mouth daily.  . Magnesium 100 MG TABS Take 1 tablet by mouth in the morning.  . Zinc 100 MG TABS Take 1 tablet by mouth daily.      Depression screen [facility] 2/9 02/20/2019  Decreased Interest 0  Down, Depressed, Hopeless 0  PHQ - 2 Score 0     Objective:   Today's Vitals: BP 130/72 (BP Location: Right Arm, Patient Position: Sitting, Cuff Size: Normal)   Pulse 69   Temp 98.2 F (36.8 C) (Temporal)   Resp 18   Ht 5\' 8"  (1.727 m)   Wt 153 lb (69.4 kg)   SpO2 96%   BMI 23.26 kg/m  Vitals with BMI 12/31/2019 11/25/2019 09/30/2019  Height 5\' 8"  5\' 8"  5\' 8"   Weight 153 lbs 153 lbs 157 lbs 6 oz  BMI 23.27 85.02 77.41  Systolic 287 867 672  Diastolic 72 76 80  Pulse 69 87 -     Physical Exam  She looks systemically well.  No new physical findings.     Assessment   1. Mixed hyperlipidemia   2. Age-related osteoporosis without current pathological fracture   3. Vitamin D deficiency disease       Tests ordered  No orders of the defined types were placed in this encounter.    Plan: 1. I recommended that she does take vitamin D3 as previously she was doing but does not seem to be taking it now. 2. She will follow up with Judson Roch in 6 months.  I did recommend COVID-19 booster which she has not had yet.   No orders of the defined types were placed in this encounter.   Doree Albee, MD

## 2020-01-06 DIAGNOSIS — I8312 Varicose veins of left lower extremity with inflammation: Secondary | ICD-10-CM | POA: Diagnosis not present

## 2020-01-08 DIAGNOSIS — I8311 Varicose veins of right lower extremity with inflammation: Secondary | ICD-10-CM | POA: Diagnosis not present

## 2020-01-11 DIAGNOSIS — I8311 Varicose veins of right lower extremity with inflammation: Secondary | ICD-10-CM | POA: Diagnosis not present

## 2020-01-18 ENCOUNTER — Encounter: Payer: PPO | Admitting: Physical Medicine & Rehabilitation

## 2020-01-19 DIAGNOSIS — I8312 Varicose veins of left lower extremity with inflammation: Secondary | ICD-10-CM | POA: Diagnosis not present

## 2020-01-21 DIAGNOSIS — I8312 Varicose veins of left lower extremity with inflammation: Secondary | ICD-10-CM | POA: Diagnosis not present

## 2020-01-23 HISTORY — PX: VEIN SURGERY: SHX48

## 2020-01-26 DIAGNOSIS — M7981 Nontraumatic hematoma of soft tissue: Secondary | ICD-10-CM | POA: Diagnosis not present

## 2020-02-02 DIAGNOSIS — I8312 Varicose veins of left lower extremity with inflammation: Secondary | ICD-10-CM | POA: Diagnosis not present

## 2020-02-09 DIAGNOSIS — I8311 Varicose veins of right lower extremity with inflammation: Secondary | ICD-10-CM | POA: Diagnosis not present

## 2020-02-15 ENCOUNTER — Ambulatory Visit
Admission: RE | Admit: 2020-02-15 | Discharge: 2020-02-15 | Disposition: A | Discharge status: Home / Self Care | Payer: PPO | Source: Ambulatory Visit | Attending: Physical Medicine & Rehabilitation | Admitting: Physical Medicine & Rehabilitation

## 2020-02-15 ENCOUNTER — Other Ambulatory Visit: Payer: Self-pay

## 2020-02-15 ENCOUNTER — Encounter: Payer: Self-pay | Admitting: Physical Medicine & Rehabilitation

## 2020-02-15 ENCOUNTER — Encounter: Payer: PPO | Attending: Physical Medicine & Rehabilitation | Admitting: Physical Medicine & Rehabilitation

## 2020-02-15 VITALS — BP 145/83 | HR 79 | Temp 97.8°F | Ht 68.0 in | Wt 153.0 lb

## 2020-02-15 DIAGNOSIS — M19012 Primary osteoarthritis, left shoulder: Secondary | ICD-10-CM | POA: Diagnosis not present

## 2020-02-15 DIAGNOSIS — G8929 Other chronic pain: Secondary | ICD-10-CM | POA: Insufficient documentation

## 2020-02-15 DIAGNOSIS — M25512 Pain in left shoulder: Secondary | ICD-10-CM | POA: Insufficient documentation

## 2020-02-15 DIAGNOSIS — G479 Sleep disorder, unspecified: Secondary | ICD-10-CM | POA: Diagnosis not present

## 2020-02-15 DIAGNOSIS — M791 Myalgia, unspecified site: Secondary | ICD-10-CM | POA: Insufficient documentation

## 2020-02-15 MED ORDER — METHOCARBAMOL 500 MG PO TABS: 500.0000 mg | ORAL_TABLET | Freq: Two times a day (BID) | ORAL | 1 refills | Status: DC | PRN

## 2020-02-15 MED ORDER — DICLOFENAC SODIUM 1 % EX GEL: 2.0000 g | Freq: Four times a day (QID) | CUTANEOUS | 1 refills | Status: DC
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
## 2020-02-23 DIAGNOSIS — I8312 Varicose veins of left lower extremity with inflammation: Secondary | ICD-10-CM | POA: Diagnosis not present

## 2020-03-07 ENCOUNTER — Encounter: Payer: Self-pay | Admitting: Physical Medicine & Rehabilitation

## 2020-03-07 ENCOUNTER — Encounter: Payer: PPO | Attending: Physical Medicine & Rehabilitation | Admitting: Physical Medicine & Rehabilitation

## 2020-03-07 ENCOUNTER — Other Ambulatory Visit: Payer: Self-pay

## 2020-03-07 VITALS — BP 138/86 | HR 64 | Temp 97.5°F | Ht 68.0 in | Wt 153.0 lb

## 2020-03-07 DIAGNOSIS — G8929 Other chronic pain: Secondary | ICD-10-CM | POA: Diagnosis not present

## 2020-03-07 DIAGNOSIS — M25512 Pain in left shoulder: Secondary | ICD-10-CM | POA: Insufficient documentation

## 2020-03-07 DIAGNOSIS — G479 Sleep disorder, unspecified: Secondary | ICD-10-CM | POA: Diagnosis not present
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
## 2020-03-07 NOTE — Addendum Note (Signed)
Addended by: Delice Lesch A on: 03/07/2020 04:19 PM   Modules accepted: Orders

## 2020-03-08 ENCOUNTER — Encounter: Payer: Self-pay | Admitting: Occupational Therapy

## 2020-03-08 ENCOUNTER — Ambulatory Visit: Payer: PPO | Attending: Physical Medicine & Rehabilitation | Admitting: Occupational Therapy

## 2020-03-08 DIAGNOSIS — M6281 Muscle weakness (generalized): Secondary | ICD-10-CM | POA: Insufficient documentation

## 2020-03-08 DIAGNOSIS — M25612 Stiffness of left shoulder, not elsewhere classified: Secondary | ICD-10-CM | POA: Diagnosis not present

## 2020-03-08 DIAGNOSIS — M25512 Pain in left shoulder: Secondary | ICD-10-CM | POA: Diagnosis not present

## 2020-03-08 DIAGNOSIS — I8311 Varicose veins of right lower extremity with inflammation: Secondary | ICD-10-CM | POA: Diagnosis not present
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
## 2020-03-16 ENCOUNTER — Ambulatory Visit: Payer: PPO | Admitting: Occupational Therapy

## 2020-03-16 ENCOUNTER — Other Ambulatory Visit: Payer: Self-pay

## 2020-03-16 DIAGNOSIS — M6281 Muscle weakness (generalized): Secondary | ICD-10-CM

## 2020-03-16 DIAGNOSIS — M25612 Stiffness of left shoulder, not elsewhere classified: Secondary | ICD-10-CM

## 2020-03-16 DIAGNOSIS — M25512 Pain in left shoulder: Secondary | ICD-10-CM | POA: Diagnosis not present

## 2020-03-16 NOTE — Therapy (Signed)
[place] [address] [place], [place], [address] Phone: [phone]   Fax:  [phone]  Occupational Therapy Treatment  Patient Details  Name: Amy Kelley MRN: [id number] Date of Birth: Nov 24, 1955 Referring Provider (OT): Dr. Jamse Arn   Encounter Date: 03/16/2020   OT End of Session - 03/16/20 1104    Visit Number 2    Number of Visits 17    Date for OT Re-Evaluation 05/07/20    Authorization Type HT Advantage:  follow Medicare guidelines    Authorization - Visit Number 2    Authorization - Number of Visits 10    Progress Note Due on Visit 10    OT Start Time 1015    OT Stop Time 1100    OT Time Calculation (min) 45 min    Activity Tolerance Patient tolerated treatment well    Behavior During Therapy [facility] for tasks assessed/performed           Past Medical History:  Diagnosis Date  . Arthritis   . Cancer ([place])    skin cancer   . Headache    migraines  . Hematuria 06/30/2013  . HLD (hyperlipidemia) 12/10/2018  . Osteoporosis 12/10/2018  . PMB (postmenopausal bleeding) 05/02/2015  . Vaginal atrophy 06/30/2013  . Vitamin D deficiency disease 12/10/2018    Past Surgical History:  Procedure Laterality Date  . CESAREAN SECTION    . COLONOSCOPY N/A 10/15/2013   Procedure: COLONOSCOPY;  Surgeon: Rogene Houston, MD;  [facility];  Service: Endoscopy;  Laterality: N/A;  930  . PARS PLANA VITRECTOMY Right 05/26/2016   Procedure: PARS PLANA VITRECTOMY WITH ENDO LASER, SF6 INJECTION;  Surgeon: Jalene Mullet, MD;  Location: [place];  Service: Ophthalmology;  Laterality: Right;  . skin cancer removal     on left leg about 5 years ago or more.  Marland Kitchen VEIN SURGERY  2022   By [facility]    There were no vitals filed for this visit.    Ultrasound x 8 min to upper Lt arm (lateral side along middle deltoid) at 3 Mhz, continuous, 1.0 wts/cm2 for pain.  Discussed kinesiotape, purpose, and wear and care and  then applied to middle deltoid and upper trap to relax muscles. Pt to remove this Sunday as she returns Monday and can then reapply if it helps.  Pt required cues t/o session to relax as she would often tense and hold arm in slight abduction. Pt shown alternative way to doff overhead shirt to decrease pain - pt demo with less pain reported.  Pt noted to have hypermobility Lt scapula compared to Rt with BUE sh flexion.  Issued initial HEP - see pt instructions for further details. Pt demo each x 5 reps.                      OT Education - 03/16/20 1101    Education Details HEP, kinesiotape wear and care (verbally)    Person(s) Educated Patient    Methods Explanation;Demonstration;Handout;Verbal cues    Comprehension Verbalized understanding;Returned demonstration;Verbal cues required            OT Short Term Goals - 03/16/20 1106      OT SHORT TERM GOAL #1   Title Pt will be independent with initial HEP for ROM.--check STGs 04/04/20    Time 4    Period Weeks    Status On-going      OT SHORT TERM GOAL #2  Title Pt will demo at least 145* R shoulder flex for functional reaching.    Baseline 140*    Time 4    Period Weeks    Status New      OT SHORT TERM GOAL #3   Title Pt will be able to dress without LUE pain.    Time 4    Status On-going      OT SHORT TERM GOAL #4   Title Pt will verbalize understanding of proper positioning of LUE to decr pain.    Time 4    Period Weeks    Status New             OT Long Term Goals - 03/08/20 1500      OT LONG TERM GOAL #1   Title Pt will be independent with updated HEP.--check LTGs    Time 8    Period Weeks    Status New      OT LONG TERM GOAL #2   Title Pt will improve LUE functional use and decr pain as shown by improving score on Upper Extremity Functional Scale to at least 74/80.    Time 8    Period Weeks    Status New      OT LONG TERM GOAL #3   Title Pt will retrieve/replace at least 4lb object on  overhead shelf with LUE with no pain.    Time 8    Period Weeks    Status New                 Plan - 03/16/20 1113    Clinical Impression Statement Pt with hypermobility Lt scapula (compared to Rt). Pt with greater understanding of proper reaching pattern with higher level reaching. Pt also tenses even at rest and required cues to relax t/o session    OT Occupational Profile and History Problem Focused Assessment - Including review of records relating to presenting problem    Occupational performance deficits (Please refer to evaluation for details): ADL's;IADL's;Leisure;Work;Rest and Sleep    Body Structure / Function / Physical Skills ADL;Strength;UE functional use;ROM;Endurance;Pain;Decreased knowledge of precautions    Rehab Potential Good    Clinical Decision Making Limited treatment options, no task modification necessary    Comorbidities Affecting Occupational Performance: None    Modification or Assistance to Complete Evaluation  No modification of tasks or assist necessary to complete eval    OT Frequency 2x / week    OT Duration 8 weeks   +eval (may only need 6 wks depending on progress)   OT Treatment/Interventions Self-care/ADL training;Moist Heat;DME and/or AE instruction;Therapeutic activities;Aquatic Therapy;Ultrasound;Therapeutic exercise;Passive range of motion;Neuromuscular education;Iontophoresis;Cryotherapy;Electrical Stimulation;Manual Therapy;Patient/family education    Plan continue [place], assess if taping helped and retape on Monday (pt was instructed to doff on Sunday), review HEP and work on scapula depression and stabilization    Consulted and Agree with Plan of Care Patient           Patient will benefit from skilled therapeutic intervention in order to improve the following deficits and impairments:   Body Structure / Function / Physical Skills: ADL,Strength,UE functional use,ROM,Endurance,Pain,Decreased knowledge of precautions       Visit  Diagnosis: Left shoulder pain, unspecified chronicity  Stiffness of left shoulder, not elsewhere classified  Muscle weakness (generalized)    Problem List Patient Active Problem List   Diagnosis Date Noted  . Chronic left shoulder pain 02/15/2020  . Sleep disturbance 02/15/2020  . Cervical polyp 02/20/2019  . Screening  for colorectal cancer 02/20/2019  . Encounter for gynecological examination with Papanicolaou smear of cervix 02/20/2019  . Vitamin D deficiency disease 12/10/2018  . HLD (hyperlipidemia) 12/10/2018  . Osteoporosis 12/10/2018  . Vaginal atrophy 06/30/2013    Carey Bullocks, OTR/L 03/16/2020, 11:18 AM  [facility] [address] [place], [place], [address] Phone: [phone]   Fax:  [phone]  Name: Amy Kelley MRN: [id number] Date of Birth: [birth year]

## 2020-03-16 NOTE — Patient Instructions (Signed)
1. SHOULDER: Flexion Bilateral    Raise arms overhead at same speed. Keep elbows straight. _10__ reps per set, _2__ sets per day  2. Scapular Retraction (Prone)    Lie with arms at sides. Pinch shoulder blades together and down towards back pockets. Hold 3 seconds. Repeat _10___ times per set.  Do _2___ sessions per day.  Extension - Prone (Dumbbell)    Lie with right arm hanging off side of bed. Lift hand back and up. Hold 3 sec Repeat __10__ times per set.  Do _2___ sessions per day

## 2020-03-21 ENCOUNTER — Encounter: Payer: Self-pay | Admitting: Dermatology

## 2020-03-21 ENCOUNTER — Ambulatory Visit: Payer: PPO | Admitting: Dermatology

## 2020-03-21 ENCOUNTER — Telehealth: Payer: Self-pay

## 2020-03-21 ENCOUNTER — Other Ambulatory Visit: Payer: Self-pay

## 2020-03-21 ENCOUNTER — Ambulatory Visit: Payer: PPO | Admitting: Occupational Therapy

## 2020-03-21 DIAGNOSIS — M25612 Stiffness of left shoulder, not elsewhere classified: Secondary | ICD-10-CM

## 2020-03-21 DIAGNOSIS — L738 Other specified follicular disorders: Secondary | ICD-10-CM | POA: Diagnosis not present

## 2020-03-21 DIAGNOSIS — L720 Epidermal cyst: Secondary | ICD-10-CM

## 2020-03-21 DIAGNOSIS — L821 Other seborrheic keratosis: Secondary | ICD-10-CM

## 2020-03-21 DIAGNOSIS — Z1283 Encounter for screening for malignant neoplasm of skin: Secondary | ICD-10-CM

## 2020-03-21 DIAGNOSIS — M6281 Muscle weakness (generalized): Secondary | ICD-10-CM

## 2020-03-21 DIAGNOSIS — M25512 Pain in left shoulder: Secondary | ICD-10-CM | POA: Diagnosis not present

## 2020-03-21 NOTE — Telephone Encounter (Signed)
Patient called to say that insurance will cover the removal and she will be responsible for the copay.  Patient did say also that [facility] would need to call the benefits department as well.
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
## 2020-03-21 NOTE — Telephone Encounter (Signed)
Patient called and wanted to know codes for cyst on back  82955 2.0 520$ 88304 path 99$ [id number] larger than 2.0  At the office visit Dr Denna Haggard asked that patient get all begin lesions approved with insurance before treatment. Start with cyst then see how they cover.  Also patient wanted to know if tretinoin would be a option fir hyperplasia on her face

## 2020-03-21 NOTE — Telephone Encounter (Signed)
Phone call from patient saying her insurance company told her a prior authroization wasn't nesicary for the codes she gave them. However, she wants to make sure she won't be responsible for the full amount and request [place] do a prior authroization just incase her insurance won't pay anything. I told patient I would work on that for her and let her know when we got a response from [facility].

## 2020-03-22 NOTE — Telephone Encounter (Signed)
This message can be routed to [facility].  My understanding is that if a procedure does not require preauthorization, it is a waste of time and energy to seek this.

## 2020-03-22 NOTE — Telephone Encounter (Signed)
Phone call to patient to let her know [employer] responded back by fax and approved her cyst removal. Authorization number [id number]. Appointment made June 2 at [facility] with Dr Denna Haggard for the cyst removal. Authorization sent to the scan center to be entered into the  patients chart. Any questions call [employer] at [phone]

## 2020-03-24 ENCOUNTER — Ambulatory Visit: Payer: PPO | Attending: Physical Medicine & Rehabilitation | Admitting: Occupational Therapy

## 2020-03-24 ENCOUNTER — Other Ambulatory Visit: Payer: Self-pay

## 2020-03-24 DIAGNOSIS — M25512 Pain in left shoulder: Secondary | ICD-10-CM | POA: Insufficient documentation

## 2020-03-24 DIAGNOSIS — M6281 Muscle weakness (generalized): Secondary | ICD-10-CM | POA: Insufficient documentation

## 2020-03-24 DIAGNOSIS — M25612 Stiffness of left shoulder, not elsewhere classified: Secondary | ICD-10-CM | POA: Insufficient documentation
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
## 2020-03-28 ENCOUNTER — Other Ambulatory Visit: Payer: Self-pay

## 2020-03-28 ENCOUNTER — Ambulatory Visit: Payer: PPO | Admitting: Occupational Therapy

## 2020-03-28 DIAGNOSIS — M25612 Stiffness of left shoulder, not elsewhere classified: Secondary | ICD-10-CM

## 2020-03-28 DIAGNOSIS — M25512 Pain in left shoulder: Secondary | ICD-10-CM | POA: Diagnosis not present

## 2020-03-28 DIAGNOSIS — M6281 Muscle weakness (generalized): Secondary | ICD-10-CM
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
## 2020-03-29 ENCOUNTER — Encounter: Payer: Self-pay | Admitting: Dermatology

## 2020-03-30 ENCOUNTER — Encounter: Payer: Self-pay | Admitting: Dermatology

## 2020-03-30 NOTE — Progress Notes (Signed)
° °  Follow-Up Visit   Subjective  Amy Kelley is a 65 y.o. female who presents for the following: Annual Exam (Right eye lid, right outer upper lid, bra area  cyst like, ).  Several new growths in general skin examination Location:  Duration:  Quality:  Associated Signs/Symptoms: Modifying Factors:  Severity:  Timing: Context:   Objective  Well appearing patient in no apparent distress; mood and affect are within normal limits. Objective  Left Abdomen (side) - Upper: Full body skin examination- no atypical moles or non mole skin cacner  Images      Objective  Left Eyebrow: Subtle tan cobblestone textured 3 mm papule  Objective  Right Buccal Cheek: Small cream-colored papules with a central dell.   Objective  Mid Back: Nonfluctuant deep dermal to subcutaneous 1cm nodule with open pore   A full examination was performed including scalp, head, eyes, ears, nose, lips, neck, chest, axillae, abdomen, back, buttocks, bilateral upper extremities, bilateral lower extremities, hands, feet, fingers, toes, fingernails, and toenails. All findings within normal limits unless otherwise noted below.  Areas beneath undergarments not examined.   Assessment & Plan    Encounter for screening for malignant neoplasm of skin Left Abdomen (side) - Upper  Yearly skin check  Seborrheic keratosis Left Eyebrow  Okay to leave if stable  Sebaceous hyperplasia of face Right Buccal Cheek  Okay to leave if stable; I did discuss the similarity of an early basal cell carcinoma.  Epidermal cyst Mid Back  Okay to leave if stable/ 30 minute surgery If patient wants removed     I, Lavonna Monarch, MD, have reviewed all documentation for this visit.  The documentation on 03/30/20 for the exam, diagnosis, procedures, and orders are all accurate and complete.

## 2020-03-30 NOTE — Addendum Note (Signed)
Addended by: Lavonna Monarch on: 03/30/2020 01:18 PM   Modules accepted: Level of Service

## 2020-03-31 ENCOUNTER — Ambulatory Visit: Payer: PPO | Admitting: Occupational Therapy

## 2020-03-31 ENCOUNTER — Other Ambulatory Visit: Payer: Self-pay

## 2020-03-31 DIAGNOSIS — M25612 Stiffness of left shoulder, not elsewhere classified: Secondary | ICD-10-CM

## 2020-03-31 DIAGNOSIS — M6281 Muscle weakness (generalized): Secondary | ICD-10-CM

## 2020-03-31 DIAGNOSIS — M25512 Pain in left shoulder: Secondary | ICD-10-CM | POA: Diagnosis not present

## 2020-03-31 NOTE — Patient Instructions (Signed)
   Strengthening: Resisted Flexion   Attach tube to door.  Hold tubing with one arm at side. Pull forward and up with elbow straight. Move shoulder through pain-free range of motion, no further than shoulder height. Repeat 10 times per set.  Do 1-2 sessions per day.    Strengthening: Resisted Extension   Attach one end to door.  Hold tubing in one hand, arm forward. Pull arm back, elbow straight. Repeat 10 times per set. Do 1-2 sessions per day.   Resisted Horizontal Abduction: Bilateral   Sit or stand, tubing in both hands, palms down and arms out in front. Keeping arms straight, pinch shoulder blades together and stretch arms out. Repeat 10 times per set.  Do 1-2 sessions per day.  **Perform at belly button level.   SHOULDER: External Rotation (Band)    Place towel between elbow and body. Keep elbow next to body. Holding band, rotate arm away from body. Hold 3 seconds. Use yellow band. 10 reps per set,  1-2x/day
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
## 2020-04-04 ENCOUNTER — Ambulatory Visit: Payer: PPO | Admitting: Occupational Therapy

## 2020-04-04 ENCOUNTER — Other Ambulatory Visit: Payer: Self-pay

## 2020-04-04 ENCOUNTER — Encounter: Payer: Self-pay | Admitting: Occupational Therapy

## 2020-04-04 DIAGNOSIS — M25512 Pain in left shoulder: Secondary | ICD-10-CM

## 2020-04-04 DIAGNOSIS — M25612 Stiffness of left shoulder, not elsewhere classified: Secondary | ICD-10-CM

## 2020-04-04 DIAGNOSIS — M6281 Muscle weakness (generalized): Secondary | ICD-10-CM
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
## 2020-04-05 MED ORDER — TRETINOIN 0.1 % EX CREA: TOPICAL_CREAM | Freq: Every day | CUTANEOUS | 2 refills | Status: DC

## 2020-04-07 ENCOUNTER — Ambulatory Visit: Payer: PPO | Admitting: Occupational Therapy

## 2020-04-07 ENCOUNTER — Other Ambulatory Visit: Payer: Self-pay

## 2020-04-07 ENCOUNTER — Encounter: Payer: Self-pay | Admitting: Occupational Therapy

## 2020-04-07 DIAGNOSIS — M25512 Pain in left shoulder: Secondary | ICD-10-CM | POA: Diagnosis not present

## 2020-04-07 DIAGNOSIS — M25612 Stiffness of left shoulder, not elsewhere classified: Secondary | ICD-10-CM

## 2020-04-07 DIAGNOSIS — M6281 Muscle weakness (generalized): Secondary | ICD-10-CM
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
## 2020-04-11 ENCOUNTER — Ambulatory Visit: Payer: PPO | Admitting: Occupational Therapy

## 2020-04-11 ENCOUNTER — Other Ambulatory Visit: Payer: Self-pay

## 2020-04-11 ENCOUNTER — Encounter: Payer: Self-pay | Admitting: Occupational Therapy

## 2020-04-11 DIAGNOSIS — M25512 Pain in left shoulder: Secondary | ICD-10-CM

## 2020-04-11 DIAGNOSIS — M6281 Muscle weakness (generalized): Secondary | ICD-10-CM

## 2020-04-11 DIAGNOSIS — M25612 Stiffness of left shoulder, not elsewhere classified: Secondary | ICD-10-CM
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
## 2020-04-12 ENCOUNTER — Encounter: Payer: PPO | Attending: Physical Medicine & Rehabilitation | Admitting: Physical Medicine & Rehabilitation

## 2020-04-12 ENCOUNTER — Encounter: Payer: Self-pay | Admitting: Physical Medicine & Rehabilitation

## 2020-04-12 VITALS — BP 145/88 | HR 70 | Temp 97.9°F | Ht 68.0 in | Wt 152.4 lb

## 2020-04-12 DIAGNOSIS — M791 Myalgia, unspecified site: Secondary | ICD-10-CM | POA: Diagnosis not present

## 2020-04-12 DIAGNOSIS — M25512 Pain in left shoulder: Secondary | ICD-10-CM | POA: Insufficient documentation

## 2020-04-12 DIAGNOSIS — G479 Sleep disorder, unspecified: Secondary | ICD-10-CM | POA: Insufficient documentation

## 2020-04-12 DIAGNOSIS — G8929 Other chronic pain: Secondary | ICD-10-CM | POA: Insufficient documentation

## 2020-04-12 NOTE — Patient Instructions (Signed)
Please trial:   1. Ice/heat  2. Consistent use of Voltaren gel  3. Daytime use of Methocarbimol  4. Trial of TENS with PT
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
## 2020-04-14 ENCOUNTER — Other Ambulatory Visit: Payer: Self-pay

## 2020-04-14 ENCOUNTER — Encounter: Payer: Self-pay | Admitting: Occupational Therapy

## 2020-04-14 ENCOUNTER — Ambulatory Visit: Payer: PPO | Admitting: Occupational Therapy

## 2020-04-14 DIAGNOSIS — M25512 Pain in left shoulder: Secondary | ICD-10-CM | POA: Diagnosis not present

## 2020-04-14 DIAGNOSIS — M25612 Stiffness of left shoulder, not elsewhere classified: Secondary | ICD-10-CM

## 2020-04-14 DIAGNOSIS — M6281 Muscle weakness (generalized): Secondary | ICD-10-CM
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
## 2020-04-18 ENCOUNTER — Encounter: Payer: Self-pay | Admitting: Occupational Therapy

## 2020-04-18 ENCOUNTER — Ambulatory Visit: Payer: PPO | Admitting: Occupational Therapy

## 2020-04-18 ENCOUNTER — Other Ambulatory Visit: Payer: Self-pay

## 2020-04-18 DIAGNOSIS — M25512 Pain in left shoulder: Secondary | ICD-10-CM

## 2020-04-18 DIAGNOSIS — M6281 Muscle weakness (generalized): Secondary | ICD-10-CM

## 2020-04-18 DIAGNOSIS — M25612 Stiffness of left shoulder, not elsewhere classified: Secondary | ICD-10-CM

## 2020-04-18 NOTE — Patient Instructions (Signed)
    Scapular Retraction: Abduction / Extension (Prone)   Lie with arms out from sides 90. Pinch shoulder blades together and raise arms a few inches from floor. Palms down.  Repeat 15 times per set. Do 1 sessions per day.  Prone Plank (Eccentric)    On knees and elbows, pull abdomen in while stabilizing trunk. Slowly lower downward without arching back.  Keep body straight and head up.  Hold 5-10sec. 10 reps per set, 5 sets per day.   1 time/day.  All Fours Shoulder Flexion / Extension    Place hands and knees shoulder-width apart, rock back and sit on legs, then rock forward over hands and forearms. Repeat 10times . Do 1 sessions per day.   SHOULDER: Internal Rotation (Band)    Place towel between elbow and body. Keep elbow next to body. Holding band, rotate arm toward body. Hold 2 seconds.  10 reps per set, 1 sets per day
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
## 2020-04-21 ENCOUNTER — Encounter: Payer: Self-pay | Admitting: Occupational Therapy

## 2020-04-21 ENCOUNTER — Ambulatory Visit: Payer: PPO | Admitting: Occupational Therapy

## 2020-04-21 ENCOUNTER — Other Ambulatory Visit: Payer: Self-pay

## 2020-04-21 DIAGNOSIS — M25512 Pain in left shoulder: Secondary | ICD-10-CM

## 2020-04-21 DIAGNOSIS — M25612 Stiffness of left shoulder, not elsewhere classified: Secondary | ICD-10-CM

## 2020-04-21 DIAGNOSIS — M6281 Muscle weakness (generalized): Secondary | ICD-10-CM

## 2020-04-21 NOTE — Patient Instructions (Signed)
    Lifting Principles  .Maintain proper posture and head alignment. .Slide object as close as possible before lifting. .Move obstacles out of the way. .Test before lifting; ask for help if too heavy. .Tighten stomach muscles without holding breath. .Use smooth movements; do not jerk. .Use legs to do the work, and pivot with feet. .Distribute the work load symmetrically and close to the center of trunk. .Push instead of pull whenever possible. -Have hands positioned with thumbs up or palms up whenever possible    Overhead    Shift weight from front foot to back foot as item is lifted off shelf.   Car Trunk - Reaching Down    Maintain curve of lower back when reaching into a deep trunk. Can also lift opposite leg backward to keep back straight, while using other hand for support.  Shift weight   Reaching Into Drawer    Squat to reach or rearrange your work area, and avoid twisting and bending.    Low Shelf    Squat down, and bring item close to lift.
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
## 2020-06-14 ENCOUNTER — Encounter: Payer: Self-pay | Admitting: Physical Medicine & Rehabilitation

## 2020-06-14 ENCOUNTER — Other Ambulatory Visit: Payer: Self-pay

## 2020-06-14 ENCOUNTER — Encounter: Payer: PPO | Attending: Physical Medicine & Rehabilitation | Admitting: Physical Medicine & Rehabilitation

## 2020-06-14 VITALS — BP 127/77 | HR 64 | Temp 97.8°F | Ht 68.0 in | Wt 152.2 lb

## 2020-06-14 DIAGNOSIS — M25512 Pain in left shoulder: Secondary | ICD-10-CM | POA: Diagnosis not present

## 2020-06-14 DIAGNOSIS — G8929 Other chronic pain: Secondary | ICD-10-CM | POA: Insufficient documentation

## 2020-06-14 DIAGNOSIS — M791 Myalgia, unspecified site: Secondary | ICD-10-CM | POA: Insufficient documentation

## 2020-06-14 DIAGNOSIS — G479 Sleep disorder, unspecified: Secondary | ICD-10-CM | POA: Diagnosis not present

## 2020-06-14 MED ORDER — METHOCARBAMOL 500 MG PO TABS: 500.0000 mg | ORAL_TABLET | Freq: Two times a day (BID) | ORAL | 3 refills | Status: DC | PRN

## 2020-06-14 MED ORDER — DICLOFENAC SODIUM 1 % EX GEL: 2.0000 g | Freq: Four times a day (QID) | CUTANEOUS | 3 refills | Status: DC
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
## 2020-06-15 ENCOUNTER — Ambulatory Visit: Payer: PPO | Admitting: Dermatology

## 2020-06-16 ENCOUNTER — Encounter: Payer: Self-pay | Admitting: Dermatology

## 2020-06-23 ENCOUNTER — Encounter: Payer: PPO | Admitting: Dermatology

## 2020-06-30 ENCOUNTER — Ambulatory Visit (INDEPENDENT_AMBULATORY_CARE_PROVIDER_SITE_OTHER): Payer: PPO | Admitting: Nurse Practitioner

## 2020-07-14 ENCOUNTER — Other Ambulatory Visit: Payer: Self-pay

## 2020-07-14 ENCOUNTER — Ambulatory Visit (INDEPENDENT_AMBULATORY_CARE_PROVIDER_SITE_OTHER): Payer: PPO | Admitting: Nurse Practitioner

## 2020-07-14 ENCOUNTER — Encounter (INDEPENDENT_AMBULATORY_CARE_PROVIDER_SITE_OTHER): Payer: Self-pay | Admitting: Nurse Practitioner

## 2020-07-14 VITALS — BP 116/76 | HR 64 | Temp 97.5°F | Ht 68.0 in | Wt 152.8 lb

## 2020-07-14 DIAGNOSIS — E559 Vitamin D deficiency, unspecified: Secondary | ICD-10-CM

## 2020-07-14 DIAGNOSIS — E782 Mixed hyperlipidemia: Secondary | ICD-10-CM

## 2020-07-14 DIAGNOSIS — M810 Age-related osteoporosis without current pathological fracture: Secondary | ICD-10-CM

## 2020-07-14 NOTE — Progress Notes (Signed)
Subjective:  Patient ID: Amy Kelley, female    DOB: 1955/10/17  Age: 65 y.o. MRN: [id number]  CC:  Chief Complaint  Patient presents with   Follow-up    Needs Tdap if her insurance covers this, doing well, no concerns   Other    Osteoporosis, vitamin D deficiency   Hyperlipidemia      HPI  This patient arrives today for the above.  Osteoporosis/vitamin D deficiency: She continues on vitamin D3 supplementation.  She gets approximately 6000 IUs by mouth daily.  Last serum check vitamin D level was collected about 18 months ago and was 78.  Hyperlipidemia: Last LDL was 139 this was collected within the last year.  ASCVD risk score is approximately 5.7.  She is not on any medication currently to treat her hyperlipidemia.  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   Social History   Tobacco Use   Smoking status: Never   Smokeless tobacco: Never  Substance Use Topics   Alcohol use: No     Current Meds  Medication Sig   calcium-vitamin D 250-100 MG-UNIT tablet Take 1 tablet by mouth daily.   Cholecalciferol (VITAMIN D-3) 125 MCG (5000 UT) TABS Take 5,000 Units by mouth daily at 12 noon.   diclofenac Sodium (VOLTAREN) 1 % GEL Apply 2 g topically 4 (four) times daily.   Lysine 1000 MG TABS Take 1 tablet by mouth daily.   Magnesium 100 MG TABS Take 1 tablet by mouth in the morning.   Menaquinone-7  (VITAMIN K2) 100 MCG CAPS Take 1 capsule by mouth daily.   methocarbamol (ROBAXIN) 500 MG tablet Take 1 tablet (500 mg total) by mouth 2 (two) times daily as needed for muscle spasms.   tretinoin (RETIN-A) 0.1 % cream Apply topically at bedtime.   vitamin C (ASCORBIC ACID) 500 MG tablet Take 500 mg by mouth daily.   Zinc 100 MG TABS Take 1 tablet by mouth daily. prn    ROS:  Review of Systems  Eyes:  Negative for blurred vision.  Respiratory:  Negative for shortness of breath.   Cardiovascular:  Negative for chest pain.  Neurological:  Negative for dizziness and headaches.    Objective:   Today's Vitals: BP 116/76   Pulse 64   Temp (!) 97.5 F (36.4 C) (Temporal)   Ht $R'5\' 8"'ik$  (1.727 m)   Wt 152 lb 12.8 oz (69.3 kg)   SpO2 96%   BMI 23.23 kg/m  Vitals with BMI 07/14/2020 06/14/2020 04/12/2020  Height $Remov'5\' 8"'saMCVu$  $Remove'5\' 8"'EArBijR$  $RemoveB'5\' 8"'TWSvhWQV$   Weight 152 lbs 13 oz 152 lbs 3 oz 152 lbs 6 oz  BMI 23.24 77.41 28.78  Systolic 676 720 947  Diastolic 76 77 88  Pulse 64 64 70     Physical Exam Vitals reviewed.  Constitutional:      General: She is not in acute distress.    Appearance: Normal appearance.  HENT:     Head: Normocephalic and atraumatic.  Neck:     Vascular: No carotid bruit.  Cardiovascular:     Rate and Rhythm: Normal rate and regular rhythm.     Pulses: Normal pulses.     Heart sounds: Normal heart sounds.  Pulmonary:     Effort: Pulmonary effort is normal.     Breath sounds: Normal breath sounds.  Skin:    General: Skin is warm and dry.  Neurological:     General: No focal deficit present.     Mental Status: She is alert and oriented to person, place, and time.  Psychiatric:        Mood and Affect: Mood normal.        Behavior: Behavior normal.        Judgment: Judgment normal.         Assessment and Plan   1. Vitamin D deficiency disease   2. Mixed hyperlipidemia   3. Age-related osteoporosis without current pathological fracture      Plan: 1.,  3.  We will  check serum vitamin D level and metabolic panel today for further evaluation.  In the meantime she will continue taking her supplement. 2.  We did discuss her ASCVD risk score and what this represents.  I do not feel she needs to start statin therapy at this time.  She is agreeable to this and we will continue to monitor lipid panel periodically.   Tests ordered Orders Placed This Encounter  Procedures   Vitamin D, 25-hydroxy   CMP with eGFR([facility])      No orders of the defined types were placed in this encounter.   Patient to follow-up in 3 months for annual wellness visit, or sooner as needed.  Ailene Ards, NP

## 2020-07-15 LAB — COMPLETE METABOLIC PANEL WITH GFR
AG Ratio: 1.7 (calc) (ref 1.0–2.5)
ALT: 18 U/L (ref 6–29)
AST: 23 U/L (ref 10–35)
Albumin: 4.3 g/dL (ref 3.6–5.1)
Alkaline phosphatase (APISO): 95 U/L (ref 37–153)
BUN: 13 mg/dL (ref 7–25)
CO2: 29 mmol/L (ref 20–32)
Calcium: 9.9 mg/dL (ref 8.6–10.4)
Chloride: 103 mmol/L (ref 98–110)
Creat: 0.76 mg/dL (ref 0.50–0.99)
GFR, Est African American: 95 mL/min/{1.73_m2} (ref >=60)
GFR, Est Non African American: 82 mL/min/{1.73_m2} (ref >=60)
Globulin: 2.5 g/dL (calc) (ref 1.9–3.7)
Glucose, Bld: 85 mg/dL (ref 65–99)
Potassium: 4.8 mmol/L (ref 3.5–5.3)
Sodium: 140 mmol/L (ref 135–146)
Total Bilirubin: 0.4 mg/dL (ref 0.2–1.2)
Total Protein: 6.8 g/dL (ref 6.1–8.1)

## 2020-07-15 LAB — VITAMIN D 25 HYDROXY (VIT D DEFICIENCY, FRACTURES): Vit D, 25-Hydroxy: 70 ng/mL (ref 30–100)

## 2020-07-21 ENCOUNTER — Other Ambulatory Visit: Payer: Self-pay | Admitting: Dermatology

## 2020-07-21 ENCOUNTER — Other Ambulatory Visit: Payer: Self-pay

## 2020-07-21 ENCOUNTER — Ambulatory Visit (INDEPENDENT_AMBULATORY_CARE_PROVIDER_SITE_OTHER): Payer: PPO | Admitting: Dermatology

## 2020-07-21 DIAGNOSIS — L720 Epidermal cyst: Secondary | ICD-10-CM

## 2020-07-21 NOTE — Patient Instructions (Signed)

## 2020-08-02 ENCOUNTER — Ambulatory Visit: Payer: PPO

## 2020-08-03 ENCOUNTER — Ambulatory Visit (INDEPENDENT_AMBULATORY_CARE_PROVIDER_SITE_OTHER): Payer: PPO | Admitting: *Deleted

## 2020-08-03 ENCOUNTER — Other Ambulatory Visit: Payer: Self-pay

## 2020-08-03 DIAGNOSIS — Z4802 Encounter for removal of sutures: Secondary | ICD-10-CM

## 2020-08-03 NOTE — Progress Notes (Signed)
Here for NTS suture removal. No signs or symptoms of infection. Path to patient. 

## 2020-08-07 ENCOUNTER — Encounter: Payer: Self-pay | Admitting: Dermatology

## 2020-08-07 NOTE — Progress Notes (Signed)
   Follow-Up Visit   Subjective  Amy Kelley is a 65 y.o. female who presents for the following: Procedure (Here for cyst removal on back. ).  Cyst on back which patient wants removed Location:  Duration:  Quality:  Associated Signs/Symptoms: Modifying Factors:  Severity:  Timing: Context:   Objective  Well appearing patient in no apparent distress; mood and affect are within normal limits. Left Mid Back Patient requests removal of cyst on back.  Told preoperatively risks of recurrence, scar, infection.    A focused examination was performed including back. Relevant physical exam findings are noted in the Assessment and Plan.   Assessment & Plan    Epidermoid cyst Left Mid Back  Skin excision - Left Mid Back  Lesion length (cm):  1.4 Lesion width (cm):  1.4 Margin per side (cm):  0 Total excision diameter (cm):  1.4 Informed consent: discussed and consent obtained   Timeout: patient name, date of birth, surgical site, and procedure verified   Anesthesia: the lesion was anesthetized in a standard fashion   Anesthetic:  1% lidocaine w/ epinephrine 1-100,000 local infiltration Instrument used: #15 blade   Hemostasis achieved with: pressure and electrodesiccation   Outcome: patient tolerated procedure well with no complications   Post-procedure details: sterile dressing applied and wound care instructions given   Dressing type: bandage, petrolatum and pressure dressing    Skin repair - Left Mid Back Complexity:  Simple Informed consent: discussed and consent obtained   Timeout: patient name, date of birth, surgical site, and procedure verified   Procedure prep:  Patient was prepped and draped in usual sterile fashion (non sterile) Prep type:  Chlorhexidine Anesthesia: the lesion was anesthetized in a standard fashion   Undermining: edges undermined   Fine/surface layer approximation (top stitches):  Suture size:  3-0 Suture type: nylon   Suture type comment:   Nylon Stitches: simple running   Suture removal (days):  12 Hemostasis achieved with: suture and pressure Outcome: patient tolerated procedure well with no complications   Post-procedure details: sterile dressing applied and wound care instructions given   Post-procedure details comment:  Non sterile pressure  Dressing type: petrolatum and pressure dressing   Additional details:  3 sutures  Specimen 1 - Surgical pathology Differential Diagnosis: R/O Cyst  Check Margins: No      I, Lavonna Monarch, MD, have reviewed all documentation for this visit.  The documentation on 08/07/20 for the exam, diagnosis, procedures, and orders are all accurate and complete.

## 2020-08-24 DIAGNOSIS — I87323 Chronic venous hypertension (idiopathic) with inflammation of bilateral lower extremity: Secondary | ICD-10-CM | POA: Diagnosis not present

## 2020-09-02 DIAGNOSIS — H35371 Puckering of macula, right eye: Secondary | ICD-10-CM | POA: Diagnosis not present

## 2020-09-02 DIAGNOSIS — H338 Other retinal detachments: Secondary | ICD-10-CM | POA: Diagnosis not present

## 2020-09-02 DIAGNOSIS — H43391 Other vitreous opacities, right eye: Secondary | ICD-10-CM | POA: Diagnosis not present

## 2020-09-02 DIAGNOSIS — H26491 Other secondary cataract, right eye: Secondary | ICD-10-CM | POA: Diagnosis not present

## 2020-09-02 DIAGNOSIS — H59811 Chorioretinal scars after surgery for detachment, right eye: Secondary | ICD-10-CM | POA: Diagnosis not present

## 2020-09-02 DIAGNOSIS — Z961 Presence of intraocular lens: Secondary | ICD-10-CM | POA: Diagnosis not present

## 2020-09-14 ENCOUNTER — Encounter: Payer: PPO | Attending: Physical Medicine and Rehabilitation | Admitting: Physical Medicine and Rehabilitation

## 2020-09-14 ENCOUNTER — Other Ambulatory Visit: Payer: Self-pay

## 2020-09-14 ENCOUNTER — Encounter: Payer: Self-pay | Admitting: Physical Medicine and Rehabilitation

## 2020-09-14 VITALS — BP 153/93 | HR 73 | Temp 98.0°F | Ht 68.0 in | Wt 153.8 lb

## 2020-09-14 DIAGNOSIS — M7918 Myalgia, other site: Secondary | ICD-10-CM | POA: Diagnosis not present

## 2020-09-14 DIAGNOSIS — M791 Myalgia, unspecified site: Secondary | ICD-10-CM | POA: Insufficient documentation

## 2020-09-14 DIAGNOSIS — M25512 Pain in left shoulder: Secondary | ICD-10-CM | POA: Diagnosis not present

## 2020-09-14 DIAGNOSIS — N393 Stress incontinence (female) (male): Secondary | ICD-10-CM | POA: Diagnosis not present

## 2020-09-14 DIAGNOSIS — G8929 Other chronic pain: Secondary | ICD-10-CM | POA: Insufficient documentation
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
## 2020-10-19 ENCOUNTER — Encounter (INDEPENDENT_AMBULATORY_CARE_PROVIDER_SITE_OTHER): Payer: PPO | Admitting: Nurse Practitioner

## 2020-10-28 DIAGNOSIS — N393 Stress incontinence (female) (male): Secondary | ICD-10-CM | POA: Diagnosis not present

## 2020-11-08 DIAGNOSIS — M6281 Muscle weakness (generalized): Secondary | ICD-10-CM | POA: Diagnosis not present

## 2020-11-08 DIAGNOSIS — N3946 Mixed incontinence: Secondary | ICD-10-CM | POA: Diagnosis not present

## 2020-11-08 DIAGNOSIS — R350 Frequency of micturition: Secondary | ICD-10-CM | POA: Diagnosis not present

## 2020-12-06 ENCOUNTER — Ambulatory Visit: Payer: PPO | Admitting: Dermatology

## 2020-12-07 ENCOUNTER — Other Ambulatory Visit (HOSPITAL_COMMUNITY): Payer: Self-pay | Admitting: Family Medicine

## 2020-12-07 DIAGNOSIS — Z1231 Encounter for screening mammogram for malignant neoplasm of breast: Secondary | ICD-10-CM

## 2020-12-07 DIAGNOSIS — M25519 Pain in unspecified shoulder: Secondary | ICD-10-CM | POA: Diagnosis not present

## 2020-12-07 DIAGNOSIS — Z0189 Encounter for other specified special examinations: Secondary | ICD-10-CM | POA: Diagnosis not present

## 2020-12-07 DIAGNOSIS — F5104 Psychophysiologic insomnia: Secondary | ICD-10-CM | POA: Diagnosis not present

## 2020-12-07 DIAGNOSIS — N393 Stress incontinence (female) (male): Secondary | ICD-10-CM | POA: Diagnosis not present

## 2020-12-08 DIAGNOSIS — H35412 Lattice degeneration of retina, left eye: Secondary | ICD-10-CM | POA: Diagnosis not present

## 2020-12-08 DIAGNOSIS — H33051 Total retinal detachment, right eye: Secondary | ICD-10-CM | POA: Diagnosis not present

## 2020-12-08 DIAGNOSIS — H5213 Myopia, bilateral: Secondary | ICD-10-CM | POA: Diagnosis not present

## 2020-12-08 DIAGNOSIS — Z961 Presence of intraocular lens: Secondary | ICD-10-CM | POA: Diagnosis not present

## 2020-12-08 DIAGNOSIS — H18593 Other hereditary corneal dystrophies, bilateral: Secondary | ICD-10-CM | POA: Diagnosis not present

## 2020-12-14 ENCOUNTER — Encounter: Payer: PPO | Attending: Physical Medicine and Rehabilitation | Admitting: Physical Medicine and Rehabilitation

## 2020-12-14 ENCOUNTER — Other Ambulatory Visit: Payer: Self-pay

## 2020-12-14 ENCOUNTER — Encounter: Payer: Self-pay | Admitting: Physical Medicine and Rehabilitation

## 2020-12-14 VITALS — BP 132/81 | HR 75 | Temp 98.0°F | Ht 68.0 in | Wt 155.0 lb

## 2020-12-14 DIAGNOSIS — G8929 Other chronic pain: Secondary | ICD-10-CM | POA: Insufficient documentation

## 2020-12-14 DIAGNOSIS — M25512 Pain in left shoulder: Secondary | ICD-10-CM | POA: Insufficient documentation

## 2020-12-14 DIAGNOSIS — M7918 Myalgia, other site: Secondary | ICD-10-CM | POA: Diagnosis not present

## 2020-12-14 NOTE — Progress Notes (Signed)
Patient is a 65 yr old female with pmh of Vit D Def, osteoporosis, headaches, OA presents with left shoulder pain and likely post-concussive syndrome Here for trigger point injections and f/u on post concussive syndrome.   Things going fairly well Didn't get theracane.  Was scared because it hurt when held pressure on trigger points.   Do Tai Chi- 1x/wee x 1 hour- very helpful.  Was working elections, so skipped some Has tried lidocaine patch a few times- not a big response.      Plan:  Patient here for trigger point injections for  Consent done and on chart.  Cleaned areas with alcohol and injected using a 27 gauge 1.5 inch needle  Injected 3cc Using 1% Lidocaine with no EPI  Upper traps  L only Levators- L only Posterior scalenes- L only Middle scalenes Splenius Capitus Pectoralis Major- L only x2 Rhomboids- L only Infraspinatus Teres Major/minor Thoracic paraspinals Lumbar paraspinals Other injections-   Patient's level of pain prior was- "Deal with it" Current level of pain after injections is- is better- with more ROM  There was no bleeding or complications.  Patient was advised to drink a lot of water on day after injections to flush system Will have increased soreness for 12-48 hours after injections.  Can use Lidocaine patches the day AFTER injections Can use theracane on day of injections in places didn't inject Can use heating pad or ice 4-6 hours AFTER injections  2. Suggest getting theracane- to help maintain the relaxations- 2-4 minutes on each trigger point- youtube to use it!  3. Went to Urology- was sent to PT- 1x- needs to reschedule.   3. F/U in 3 months. For Trigger point injections.

## 2020-12-14 NOTE — Patient Instructions (Addendum)
Plan:  Patient here for trigger point injections for  Consent done and on chart.  Cleaned areas with alcohol and injected using a 27 gauge 1.5 inch needle  Injected 3cc Using 1% Lidocaine with no EPI  Upper traps  L only Levators- L only Posterior scalenes- L only Middle scalenes Splenius Capitus Pectoralis Major- L only x2 Rhomboids- L only Infraspinatus Teres Major/minor Thoracic paraspinals Lumbar paraspinals Other injections-   Patient's level of pain prior was- "Deal with it" Current level of pain after injections is- is better- with more ROM  There was no bleeding or complications.  Patient was advised to drink a lot of water on day after injections to flush system Will have increased soreness for 12-48 hours after injections.  Can use Lidocaine patches the day AFTER injections Can use theracane on day of injections in places didn't inject Can use heating pad or ice 4-6 hours AFTER injections  2. Suggest getting theracane- to help maintain the relaxations- 2-4 minutes on each trigger point- youtube to use it!  3. Went to Urology- was sent to PT- 1x- needs to reschedule.   3. F/U in 3 months. For Trigger point injections. /

## 2020-12-19 DIAGNOSIS — Z0000 Encounter for general adult medical examination without abnormal findings: Secondary | ICD-10-CM | POA: Diagnosis not present

## 2020-12-19 DIAGNOSIS — Z131 Encounter for screening for diabetes mellitus: Secondary | ICD-10-CM | POA: Diagnosis not present

## 2020-12-22 ENCOUNTER — Ambulatory Visit (HOSPITAL_COMMUNITY)
Admission: RE | Admit: 2020-12-22 | Discharge: 2020-12-22 | Disposition: A | Discharge status: Home / Self Care | Payer: PPO | Source: Ambulatory Visit | Attending: Family Medicine | Admitting: Family Medicine

## 2020-12-22 ENCOUNTER — Other Ambulatory Visit: Payer: Self-pay

## 2020-12-22 DIAGNOSIS — Z1231 Encounter for screening mammogram for malignant neoplasm of breast: Secondary | ICD-10-CM | POA: Insufficient documentation

## 2020-12-22 DIAGNOSIS — Z0001 Encounter for general adult medical examination with abnormal findings: Secondary | ICD-10-CM | POA: Diagnosis not present

## 2020-12-22 DIAGNOSIS — E785 Hyperlipidemia, unspecified: Secondary | ICD-10-CM | POA: Diagnosis not present

## 2020-12-26 ENCOUNTER — Other Ambulatory Visit (HOSPITAL_COMMUNITY): Payer: Self-pay | Admitting: Family Medicine

## 2020-12-26 DIAGNOSIS — R928 Other abnormal and inconclusive findings on diagnostic imaging of breast: Secondary | ICD-10-CM

## 2021-01-04 ENCOUNTER — Ambulatory Visit (HOSPITAL_COMMUNITY)
Admission: RE | Admit: 2021-01-04 | Discharge: 2021-01-04 | Disposition: A | Discharge status: Home / Self Care | Payer: PPO | Source: Ambulatory Visit | Attending: Family Medicine | Admitting: Family Medicine

## 2021-01-04 ENCOUNTER — Other Ambulatory Visit: Payer: Self-pay

## 2021-01-04 ENCOUNTER — Encounter (HOSPITAL_COMMUNITY): Payer: Self-pay

## 2021-01-04 DIAGNOSIS — R928 Other abnormal and inconclusive findings on diagnostic imaging of breast: Secondary | ICD-10-CM

## 2021-01-04 DIAGNOSIS — R922 Inconclusive mammogram: Secondary | ICD-10-CM | POA: Diagnosis not present

## 2021-01-23 ENCOUNTER — Encounter: Payer: Self-pay | Admitting: Physical Medicine and Rehabilitation

## 2021-01-25 DIAGNOSIS — R350 Frequency of micturition: Secondary | ICD-10-CM | POA: Diagnosis not present

## 2021-01-25 DIAGNOSIS — N3946 Mixed incontinence: Secondary | ICD-10-CM | POA: Diagnosis not present

## 2021-01-25 DIAGNOSIS — M6281 Muscle weakness (generalized): Secondary | ICD-10-CM | POA: Diagnosis not present

## 2021-02-07 DIAGNOSIS — M62838 Other muscle spasm: Secondary | ICD-10-CM | POA: Diagnosis not present

## 2021-02-07 DIAGNOSIS — M6289 Other specified disorders of muscle: Secondary | ICD-10-CM | POA: Diagnosis not present

## 2021-02-07 DIAGNOSIS — N3946 Mixed incontinence: Secondary | ICD-10-CM | POA: Diagnosis not present

## 2021-02-07 DIAGNOSIS — M6281 Muscle weakness (generalized): Secondary | ICD-10-CM | POA: Diagnosis not present

## 2021-02-14 DIAGNOSIS — N3946 Mixed incontinence: Secondary | ICD-10-CM | POA: Diagnosis not present

## 2021-02-14 DIAGNOSIS — M6281 Muscle weakness (generalized): Secondary | ICD-10-CM | POA: Diagnosis not present

## 2021-02-14 DIAGNOSIS — R350 Frequency of micturition: Secondary | ICD-10-CM | POA: Diagnosis not present

## 2021-02-14 DIAGNOSIS — M6289 Other specified disorders of muscle: Secondary | ICD-10-CM | POA: Diagnosis not present

## 2021-02-14 DIAGNOSIS — M62838 Other muscle spasm: Secondary | ICD-10-CM | POA: Diagnosis not present

## 2021-02-28 DIAGNOSIS — M62838 Other muscle spasm: Secondary | ICD-10-CM | POA: Diagnosis not present

## 2021-02-28 DIAGNOSIS — M6289 Other specified disorders of muscle: Secondary | ICD-10-CM | POA: Diagnosis not present

## 2021-02-28 DIAGNOSIS — N3946 Mixed incontinence: Secondary | ICD-10-CM | POA: Diagnosis not present

## 2021-02-28 DIAGNOSIS — M6281 Muscle weakness (generalized): Secondary | ICD-10-CM | POA: Diagnosis not present

## 2021-03-17 ENCOUNTER — Other Ambulatory Visit: Payer: Self-pay

## 2021-03-17 ENCOUNTER — Encounter: Payer: PPO | Attending: Physical Medicine and Rehabilitation | Admitting: Physical Medicine and Rehabilitation

## 2021-03-17 ENCOUNTER — Encounter: Payer: Self-pay | Admitting: Physical Medicine and Rehabilitation

## 2021-03-17 VITALS — BP 124/82 | HR 73 | Ht 68.0 in | Wt 154.0 lb

## 2021-03-17 DIAGNOSIS — M7918 Myalgia, other site: Secondary | ICD-10-CM | POA: Diagnosis not present

## 2021-03-17 DIAGNOSIS — M25511 Pain in right shoulder: Secondary | ICD-10-CM | POA: Diagnosis not present

## 2021-03-17 DIAGNOSIS — M791 Myalgia, unspecified site: Secondary | ICD-10-CM | POA: Diagnosis not present

## 2021-03-17 DIAGNOSIS — M19011 Primary osteoarthritis, right shoulder: Secondary | ICD-10-CM | POA: Insufficient documentation

## 2021-03-17 NOTE — Progress Notes (Signed)
Subjective:    Patient ID: Amy Kelley, female    DOB: Jul 13, 1955, 66 y.o.   MRN: [id number]  HPI Patient is a 66 yr old female with pmh of Vit D Def, osteoporosis, headaches, OA presents with left shoulder pain and likely post-concussive syndrome Here for  f/u on post concussive syndrome.   Most of the pain is in the shoulders-   R shoulder is bothering her- more than L shoulder- When tries to sleep- laying on sides and even if on back. Even when on stomach-   When does outdoor activities- more pain than normally would have. Pain lasts for days- 5 days or so.   Has to try and rest for that 5 days.  Goes to tai chi class to stretch and move- 1 hour- 1x/week.  Other classes are mor vigorous, so hasn't tried.        Pain Inventory Average Pain 4 Pain Right Now 1 My pain is dull, tingling, and aching  In the last 24 hours, has pain interfered with the following? General activity 2 Relation with others 0 Enjoyment of life 5 What TIME of day is your pain at its worst? night Sleep (in general) Poor  Pain is worse with: bending and some activites Pain improves with: rest and therapy/exercise Relief from Meds: 1  Family History  Problem Relation Age of Onset   Alzheimer's disease Father    Cancer Sister        ovarian, pancreatic   Epilepsy Sister    Heart attack Maternal Grandfather    Osteoporosis Mother    Social History   Socioeconomic History   Marital status: Married    Spouse name: Not on file   Number of children: Not on file   Years of education: Not on file   Highest education level: Not on file  Occupational History   Not on file  Tobacco Use   Smoking status: Never   Smokeless tobacco: Never  Vaping Use   Vaping Use: Never used  Substance and Sexual Activity   Alcohol use: No   Drug use: No   Sexual activity: Not Currently    Birth control/protection: Post-menopausal  Other Topics Concern   Not on file  Social History Narrative    Married 40 years.Printmaker with elections.   Social Determinants of Health   Financial Resource Strain: Not on file  Food Insecurity: Not on file  Transportation Needs: Not on file  Physical Activity: Not on file  Stress: Not on file  Social Connections: Not on file   Past Surgical History:  Procedure Laterality Date   CESAREAN SECTION     COLONOSCOPY N/A 10/15/2013   Procedure: COLONOSCOPY;  Surgeon: Malissa Hippo, MD;  [facility];  Service: Endoscopy;  Laterality: N/A;  930   PARS PLANA VITRECTOMY Right 05/26/2016   Procedure: PARS PLANA VITRECTOMY WITH ENDO LASER, SF6 INJECTION;  Surgeon: Carmela Rima, MD;  Location: [facility];  Service: Ophthalmology;  Laterality: Right;   skin cancer removal     on left leg about 5 years ago or more.   VEIN SURGERY  2022   By [facility]   Past Surgical History:  Procedure Laterality Date   CESAREAN SECTION     COLONOSCOPY N/A 10/15/2013   Procedure: COLONOSCOPY;  Surgeon: Malissa Hippo, MD;  Location: [facility];  Service: Endoscopy;  Laterality: N/A;  930   PARS PLANA VITRECTOMY Right 05/26/2016   Procedure: PARS PLANA VITRECTOMY WITH  ENDO LASER, SF6 INJECTION;  Surgeon: Jalene Mullet, MD;  Location: [place];  Service: Ophthalmology;  Laterality: Right;   skin cancer removal     on left leg about 5 years ago or more.   VEIN SURGERY  2022   By [facility]   Past Medical History:  Diagnosis Date   Arthritis    Cancer ([place])    skin cancer    Headache    migraines   Hematuria 06/30/2013   HLD (hyperlipidemia) 12/10/2018   Osteoporosis 12/10/2018   PMB (postmenopausal bleeding) 05/02/2015   Vaginal atrophy 06/30/2013   Vitamin D deficiency disease 12/10/2018   BP 124/82    Pulse 73    Ht $R'5\' 8"'ix$  (1.727 m)    Wt 154 lb (69.9 kg)    SpO2 98%    BMI 23.42 kg/m   Opioid Risk Score:   Fall Risk Score:  `1  Depression screen PHQ 2/9  Depression screen [facility] 2/9 12/14/2020 09/14/2020 07/14/2020 06/14/2020 04/12/2020 02/15/2020 02/20/2019   Decreased Interest 0 0 0 0 0 0 0  Down, Depressed, Hopeless 0 0 0 0 0 0 0  PHQ - 2 Score 0 0 0 0 0 0 0  Altered sleeping - - 0 - - 1 -  Tired, decreased energy - - 0 - - 1 -  Change in appetite - - 0 - - 1 -  Feeling bad or failure about yourself  - - 0 - - 0 -  Trouble concentrating - - 0 - - 2 -  Moving slowly or fidgety/restless - - 0 - - 0 -  Suicidal thoughts - - 0 - - 0 -  PHQ-9 Score - - 0 - - 5 -  Difficult doing work/chores - - Not difficult at all - - - -     Review of Systems  Musculoskeletal:        Bilateral shoulder pain  All other systems reviewed and are negative.     Objective:   Physical Exam Awake, alert, appropriate, on table- with husband, NAD TTP over R AC joint- very TTP Very tight trigger points- in R>L upper traps- less so in scalenes, levators and pecs      Assessment & Plan:   Patient is a 66 yr old female with pmh of Vit D Def, osteoporosis, headaches, OA presents with left shoulder pain and likely post-concussive syndrome Here for  f/u on post concussive syndrome.  Not taking Robaxin anymore since caused Headache.   2. Advil/Ibuprofen- 200-400 mg- up to 3x/day- 1 week at most then back off to as needed.    3. Arm relaxation techniques- demonstrated arm relaxation exercises - by pulling arms B/L-one at a time up through full range of motion. Start low and work while laying down to getting arm above head. Best done on the bed at home.  Can do arm exercises against the wall- make sure hand is flat against wall and body at an angle.   4. Has AC joint- shoulder arthritis most likely, based on exam- can use Advil/Ibuprofen as needed.  5. Need muscles in neck/shoulders to RELAX with arm pull exercise before doing lifting tasks.   6. Wait on trigger point injections. Due to financial constraints.    7. Magnesium 400 mg not 100 mg - over the counter- goal is 1-2x/day- if possible 2x/day- need to take regularly- as a muscle relaxant- - only side  effect is looser stools.     8. Main thing still having  shoulder issues, not HA's and not as much neck pain.   9. F/U - 4 months. R>L shoulder pain   I spent a total of  31  minutes on total care today- >50% coordination of care- due to demonstration and showing husband how to do relaxation techniques- also educating on over the counter treatments.

## 2021-03-17 NOTE — Patient Instructions (Signed)
Patient is a 66 yr old female with pmh of Vit D Def, osteoporosis, headaches, OA presents with left shoulder pain and likely post-concussive syndrome Here for  f/u on post concussive syndrome.  Not taking Robaxin anymore since caused Headache.   2. Advil/Ibuprofen- 200-400 mg- up to 3x/day- 1 week at most then back off to as needed.    3. Arm relaxation techniques- demonstrated arm relaxation exercises - by pulling arms B/L-one at a time up through full range of motion. Start low and work while laying down to getting arm above head. Best done on the bed at home.  Can do arm exercises against the wall- make sure hand is flat against wall and body at an angle.   4. Has AC joint- shoulder arthritis most likely, based on exam- can use Advil/Ibuprofen as needed.  5. Need muscles in neck/shoulders to RELAX with arm pull exercise before doing lifting tasks.   6. Wait on trigger point injections. Due to financial constraints.    7. Magnesium 400 mg not 100 mg - over the counter- goal is 1-2x/day- if possible 2x/day- need to take regularly- as a muscle relaxant- - only side effect is looser stools.     8. Main thing still having shoulder issues, not HA's and not as much neck pain.   9. F/U - 4 months.

## 2021-03-21 DIAGNOSIS — M6281 Muscle weakness (generalized): Secondary | ICD-10-CM | POA: Diagnosis not present

## 2021-03-21 DIAGNOSIS — M62838 Other muscle spasm: Secondary | ICD-10-CM | POA: Diagnosis not present

## 2021-03-21 DIAGNOSIS — M6289 Other specified disorders of muscle: Secondary | ICD-10-CM | POA: Diagnosis not present

## 2021-03-21 DIAGNOSIS — N3946 Mixed incontinence: Secondary | ICD-10-CM | POA: Diagnosis not present

## 2021-03-22 DIAGNOSIS — Z0000 Encounter for general adult medical examination without abnormal findings: Secondary | ICD-10-CM | POA: Diagnosis not present

## 2021-03-22 DIAGNOSIS — E785 Hyperlipidemia, unspecified: Secondary | ICD-10-CM | POA: Diagnosis not present

## 2021-03-27 DIAGNOSIS — E785 Hyperlipidemia, unspecified: Secondary | ICD-10-CM | POA: Diagnosis not present

## 2021-05-15 DIAGNOSIS — R030 Elevated blood-pressure reading, without diagnosis of hypertension: Secondary | ICD-10-CM | POA: Diagnosis not present

## 2021-05-15 DIAGNOSIS — Z008 Encounter for other general examination: Secondary | ICD-10-CM | POA: Diagnosis not present

## 2021-06-20 ENCOUNTER — Ambulatory Visit: Payer: PPO | Admitting: Dermatology

## 2021-06-26 ENCOUNTER — Ambulatory Visit: Payer: PPO | Admitting: Dermatology

## 2021-06-26 ENCOUNTER — Encounter: Payer: Self-pay | Admitting: Dermatology

## 2021-06-26 DIAGNOSIS — D485 Neoplasm of uncertain behavior of skin: Secondary | ICD-10-CM | POA: Diagnosis not present

## 2021-06-26 DIAGNOSIS — L814 Other melanin hyperpigmentation: Secondary | ICD-10-CM

## 2021-06-26 DIAGNOSIS — D1801 Hemangioma of skin and subcutaneous tissue: Secondary | ICD-10-CM

## 2021-06-26 DIAGNOSIS — Z1283 Encounter for screening for malignant neoplasm of skin: Secondary | ICD-10-CM

## 2021-07-14 ENCOUNTER — Encounter: Payer: Self-pay | Admitting: Dermatology

## 2021-07-17 ENCOUNTER — Ambulatory Visit (HOSPITAL_COMMUNITY)
Admission: RE | Admit: 2021-07-17 | Discharge: 2021-07-17 | Disposition: A | Discharge status: Home / Self Care | Payer: PPO | Source: Ambulatory Visit | Attending: Family Medicine | Admitting: Family Medicine

## 2021-07-17 ENCOUNTER — Other Ambulatory Visit (HOSPITAL_COMMUNITY): Payer: Self-pay | Admitting: Family Medicine

## 2021-07-17 ENCOUNTER — Ambulatory Visit: Payer: PPO | Admitting: Physical Medicine and Rehabilitation

## 2021-07-17 DIAGNOSIS — M25562 Pain in left knee: Secondary | ICD-10-CM | POA: Insufficient documentation

## 2021-07-17 DIAGNOSIS — M25462 Effusion, left knee: Secondary | ICD-10-CM | POA: Diagnosis not present

## 2021-08-03 ENCOUNTER — Ambulatory Visit: Payer: PPO | Admitting: Orthopedic Surgery

## 2021-08-03 ENCOUNTER — Encounter: Payer: Self-pay | Admitting: Orthopedic Surgery

## 2021-08-03 VITALS — BP 126/86 | HR 74 | Ht 68.0 in | Wt 152.0 lb

## 2021-08-03 DIAGNOSIS — W182XXA Fall in (into) shower or empty bathtub, initial encounter: Secondary | ICD-10-CM | POA: Diagnosis not present

## 2021-08-03 DIAGNOSIS — M25062 Hemarthrosis, left knee: Secondary | ICD-10-CM

## 2021-08-03 NOTE — Patient Instructions (Addendum)
Please call to schedule your appointment with [facility]:    Central Scheduling [phone]   Continue to ice the knee 3-4 times a day if you see it still swollen use an ice pack for 30 minutes at a time  Take Advil 4 to 600 mg every 8 hours as needed for pain  You can also use Tylenol 5 mg every 6 hours for pain  Wear the brace for support  Limit your activities  Possible diagnoses:  Contusion, meniscal tear, FRACTURE, KNEE CAP INJURY

## 2021-08-03 NOTE — Progress Notes (Signed)
Chief Complaint  Patient presents with   Knee Pain    Lt knee pain after fall in shower DOI 07/15/21, states pain is getting better daily    HPI: 66 year old female fell in the bathtub and twisted her knee and then the knee impacted into the side of the bathtub wall.  Complains of left knee pain.  Injury was about 3 weeks ago.  Still complains of pain and swelling and limping  Past Medical History:  Diagnosis Date   Arthritis    Basal cell carcinoma 2007   left lower medial leg   Cancer ([place])    skin cancer    Headache    migraines   Hematuria 06/30/2013   HLD (hyperlipidemia) 12/10/2018   Osteoporosis 12/10/2018   PMB (postmenopausal bleeding) 05/02/2015   Vaginal atrophy 06/30/2013   Vitamin D deficiency disease 12/10/2018    BP 126/86   Pulse 74   Ht '5\' 8"'$  (1.727 m)   Wt 152 lb (68.9 kg)   BMI 23.11 kg/m    General appearance: Well-developed well-nourished no gross deformities  Cardiovascular normal pulse and perfusion normal color without edema  Neurologically no sensation loss or deficits or pathologic reflexes  Psychological: Awake alert and oriented x3 mood and affect normal  Skin no lacerations or ulcerations no nodularity no palpable masses, no erythema or nodularity  Musculoskeletal: Ambulatory with a limp  Pretty good knee effusion still.  She has some clicking in the knee some pain when I try the McMurray's maneuver some medial joint line tenderness and some chronic lateral femoral condylar tenderness  Imaging the outside imaging shows a fragment of bone near the lateral femoral condyle which is old normal joint spaces and joint effusion  A/P  Effusion question meniscal tear  Question contusion  Recommend aspiration injection  Ice  Continue ibuprofen  Knee support  Follow-up after MRI  Procedure note injection and aspiration left knee joint  Verbal consent was obtained to aspirate and inject the left knee joint   Timeout was completed  to confirm the site of aspiration and injection  An 18-gauge needle was used to aspirate the left knee joint from a suprapatellar lateral approach.  The medications used were 40 mg of Depo-Medrol and 1% lidocaine 3 cc  Anesthesia was provided by ethyl chloride and the skin was prepped with alcohol.  After cleaning the skin with alcohol an 18-gauge needle was used to aspirate the right knee joint.  We obtained 35-40 cc of fluid blood  We followed this by injection of 40 mg of Depo-Medrol and 3 cc 1% lidocaine.  There were no complications. A sterile bandage was applied.

## 2021-08-07 ENCOUNTER — Ambulatory Visit: Payer: PPO | Admitting: Dermatology

## 2021-08-09 ENCOUNTER — Ambulatory Visit: Payer: PPO | Admitting: Orthopedic Surgery

## 2021-08-16 ENCOUNTER — Ambulatory Visit (HOSPITAL_COMMUNITY)
Admission: RE | Admit: 2021-08-16 | Discharge: 2021-08-16 | Disposition: A | Discharge status: Home / Self Care | Payer: PPO | Source: Ambulatory Visit | Attending: Orthopedic Surgery | Admitting: Orthopedic Surgery

## 2021-08-16 DIAGNOSIS — M25062 Hemarthrosis, left knee: Secondary | ICD-10-CM | POA: Insufficient documentation

## 2021-08-16 DIAGNOSIS — M7989 Other specified soft tissue disorders: Secondary | ICD-10-CM | POA: Diagnosis not present

## 2021-08-16 DIAGNOSIS — S82142A Displaced bicondylar fracture of left tibia, initial encounter for closed fracture: Secondary | ICD-10-CM | POA: Diagnosis not present

## 2021-08-16 DIAGNOSIS — M25462 Effusion, left knee: Secondary | ICD-10-CM | POA: Diagnosis not present

## 2021-08-18 ENCOUNTER — Ambulatory Visit: Payer: PPO | Admitting: Orthopedic Surgery

## 2021-08-18 ENCOUNTER — Encounter: Payer: Self-pay | Admitting: Orthopedic Surgery

## 2021-08-18 DIAGNOSIS — S82142A Displaced bicondylar fracture of left tibia, initial encounter for closed fracture: Secondary | ICD-10-CM | POA: Diagnosis not present

## 2021-08-18 NOTE — Progress Notes (Addendum)
Chief Complaint  Patient presents with   Knee Pain    Left/ review MRI    Encounter Diagnosis  Name Primary?   Closed fracture of left tibial plateau, initial encounter Yes   Amy Kelley had an MRI of her knee that show she has a tibial plateau fracture we thought she might have an ACL injury because of the hemarthrosis on aspiration.  She is doing better with bracing.  Her pain is well controlled.  I reviewed her MRI showed her her tibial plateau fracture  We discussed possibility of getting further testing for osteoporosis and went ahead and did that based on previous 2 studies which showed osteopenia and osteoporosis  She will follow-up with her primary care once we get the bone density report   Encounter Diagnosis  Name Primary?   Closed fracture of left tibial plateau, initial encounter Yes

## 2021-08-23 ENCOUNTER — Other Ambulatory Visit: Payer: Self-pay | Admitting: *Deleted

## 2021-08-23 MED ORDER — TRETINOIN 0.1 % EX CREA: TOPICAL_CREAM | Freq: Every day | CUTANEOUS | 6 refills | Status: DC

## 2021-08-23 NOTE — Telephone Encounter (Signed)
Patient called for refill on her tretinoin cream.

## 2021-08-30 ENCOUNTER — Ambulatory Visit (HOSPITAL_COMMUNITY)
Admission: RE | Admit: 2021-08-30 | Discharge: 2021-08-30 | Disposition: A | Discharge status: Home / Self Care | Payer: PPO | Source: Ambulatory Visit | Attending: Orthopedic Surgery | Admitting: Orthopedic Surgery

## 2021-08-30 ENCOUNTER — Telehealth: Payer: Self-pay | Admitting: Radiology

## 2021-08-30 DIAGNOSIS — Z780 Asymptomatic menopausal state: Secondary | ICD-10-CM | POA: Diagnosis not present

## 2021-08-30 DIAGNOSIS — X58XXXA Exposure to other specified factors, initial encounter: Secondary | ICD-10-CM | POA: Insufficient documentation

## 2021-08-30 DIAGNOSIS — M810 Age-related osteoporosis without current pathological fracture: Secondary | ICD-10-CM | POA: Insufficient documentation

## 2021-08-30 DIAGNOSIS — S82142A Displaced bicondylar fracture of left tibia, initial encounter for closed fracture: Secondary | ICD-10-CM | POA: Diagnosis not present

## 2021-08-30 DIAGNOSIS — M85852 Other specified disorders of bone density and structure, left thigh: Secondary | ICD-10-CM | POA: Diagnosis not present

## 2021-08-30 NOTE — Telephone Encounter (Signed)
I have advised her to discuss with her primary care and she voiced understanding Forwarded results to her primary care, Cecile Sheerer who is with Dr Nevada Crane.

## 2021-08-30 NOTE — Progress Notes (Signed)
TELL HER SHE HAS OSTEOPOROSIS FORWARD RESULTS TO PRIMARY CARE FOR TREATMENT

## 2021-09-14 DIAGNOSIS — H35362 Drusen (degenerative) of macula, left eye: Secondary | ICD-10-CM | POA: Diagnosis not present

## 2021-09-14 DIAGNOSIS — H59811 Chorioretinal scars after surgery for detachment, right eye: Secondary | ICD-10-CM | POA: Diagnosis not present

## 2021-09-14 DIAGNOSIS — H338 Other retinal detachments: Secondary | ICD-10-CM | POA: Diagnosis not present

## 2021-09-14 DIAGNOSIS — H35371 Puckering of macula, right eye: Secondary | ICD-10-CM | POA: Diagnosis not present

## 2021-09-14 DIAGNOSIS — Z961 Presence of intraocular lens: Secondary | ICD-10-CM | POA: Diagnosis not present

## 2021-09-15 ENCOUNTER — Encounter: Payer: Self-pay | Admitting: Orthopedic Surgery

## 2021-09-15 ENCOUNTER — Ambulatory Visit (INDEPENDENT_AMBULATORY_CARE_PROVIDER_SITE_OTHER): Payer: PPO | Admitting: Orthopedic Surgery

## 2021-09-15 DIAGNOSIS — S82142D Displaced bicondylar fracture of left tibia, subsequent encounter for closed fracture with routine healing: Secondary | ICD-10-CM | POA: Diagnosis not present

## 2021-09-15 DIAGNOSIS — S82142A Displaced bicondylar fracture of left tibia, initial encounter for closed fracture: Secondary | ICD-10-CM | POA: Insufficient documentation

## 2021-09-15 NOTE — Progress Notes (Signed)
FOLLOW UP   Encounter Diagnosis  Name Primary?   Closed fracture of left tibial plateau with routine healing, subsequent encounter 07/15/21 Yes     Chief Complaint  Patient presents with   Knee Injury    LT/ fx care DOI 07/15/21     Amy Kelley continues to improve  She has a tibial plateau fracture which we only saw on MRI  She also has osteoporosis  She will see Dr. Nevada Crane to recommend treatment  She has 125 degrees of knee flexion full extension she has some weakness on quadriceps straight leg raise  She will start home exercises and continue her brace for the next 3 to 4 weeks  When she comes back we will take an x-ray so we have something to compare in case she develops any arthritic symptoms

## 2021-09-15 NOTE — Patient Instructions (Signed)
Wear brace 3 more weeks  Home exercises as prescribed  We will take an x-ray in 4 weeks when you return

## 2021-09-19 DIAGNOSIS — R7301 Impaired fasting glucose: Secondary | ICD-10-CM | POA: Diagnosis not present

## 2021-09-19 DIAGNOSIS — E785 Hyperlipidemia, unspecified: Secondary | ICD-10-CM | POA: Diagnosis not present

## 2021-09-28 DIAGNOSIS — M810 Age-related osteoporosis without current pathological fracture: Secondary | ICD-10-CM | POA: Diagnosis not present

## 2021-09-28 DIAGNOSIS — R7301 Impaired fasting glucose: Secondary | ICD-10-CM | POA: Diagnosis not present

## 2021-09-28 DIAGNOSIS — E785 Hyperlipidemia, unspecified: Secondary | ICD-10-CM | POA: Diagnosis not present

## 2021-09-28 DIAGNOSIS — J302 Other seasonal allergic rhinitis: Secondary | ICD-10-CM | POA: Diagnosis not present

## 2021-09-28 DIAGNOSIS — S82145D Nondisplaced bicondylar fracture of left tibia, subsequent encounter for closed fracture with routine healing: Secondary | ICD-10-CM | POA: Diagnosis not present

## 2021-10-16 ENCOUNTER — Ambulatory Visit: Payer: PPO | Admitting: Orthopedic Surgery

## 2021-10-16 ENCOUNTER — Ambulatory Visit (INDEPENDENT_AMBULATORY_CARE_PROVIDER_SITE_OTHER): Payer: PPO

## 2021-10-16 ENCOUNTER — Encounter: Payer: Self-pay | Admitting: Orthopedic Surgery

## 2021-10-16 DIAGNOSIS — S82142D Displaced bicondylar fracture of left tibia, subsequent encounter for closed fracture with routine healing: Secondary | ICD-10-CM | POA: Diagnosis not present

## 2021-10-16 NOTE — Progress Notes (Signed)
FOLLOW UP   Encounter Diagnosis  Name Primary?   Closed fracture of left tibial plateau with routine healing, subsequent encounter 07/15/21 Yes     Chief Complaint  Patient presents with   Knee Injury    DOI 07/15/21 improving   No Known Allergies Current Outpatient Medications  Medication Instructions   ascorbic acid (VITAMIN C) 500 mg, Oral, Daily   calcium-vitamin D 250-100 MG-UNIT tablet 1 tablet, Oral, Daily   COLLAGEN PO Oral   Lysine 1000 MG TABS 1 tablet, Oral, Daily   Magnesium 100 MG TABS 1 tablet, Oral, Every morning   Menaquinone-7 (VITAMIN K2) 100 MCG CAPS 1 capsule, Oral, Daily   Misc Natural Products (OSTEO BI-FLEX JOINT SHIELD PO) Oral   PREVNAR 20 0.5 ML injection No dose, route, or frequency recorded.   tretinoin (RETIN-A) 0.1 % cream Topical, Daily at bedtime   Vitamin D-3 5,000 Units, Oral, Daily     Amy Kelley is 66 years old she has osteoporosis she will is referred to her primary care doctor for treatment she had an insufficiency fracture of the left tibial plateau and was treated with bracing she is here for x-ray and follow-up  Her bone density test was 2.6 back in 2019 and then 2.7 in 2023 indicating a loss of bone mass.  I looked up both tests  They are considering Prolia but have not heard from primary care Dr. Nevada Crane yet encouraged to call him to discuss further  They are interested in strontium as a supplement to supplement the vitamin D calcium and weightbearing exercise.  Follow-up with [place] regarding the knee as needed  The x-ray shows fracture has healed she has grade 2 [place] OA

## 2021-12-04 ENCOUNTER — Other Ambulatory Visit: Payer: Self-pay

## 2022-01-04 DIAGNOSIS — D485 Neoplasm of uncertain behavior of skin: Secondary | ICD-10-CM | POA: Diagnosis not present

## 2022-01-04 DIAGNOSIS — D2261 Melanocytic nevi of right upper limb, including shoulder: Secondary | ICD-10-CM | POA: Diagnosis not present

## 2022-01-04 DIAGNOSIS — D3617 Benign neoplasm of peripheral nerves and autonomic nervous system of trunk, unspecified: Secondary | ICD-10-CM | POA: Diagnosis not present

## 2022-02-26 DIAGNOSIS — H35412 Lattice degeneration of retina, left eye: Secondary | ICD-10-CM | POA: Diagnosis not present

## 2022-02-26 DIAGNOSIS — H33051 Total retinal detachment, right eye: Secondary | ICD-10-CM | POA: Diagnosis not present

## 2022-02-26 DIAGNOSIS — H18593 Other hereditary corneal dystrophies, bilateral: Secondary | ICD-10-CM | POA: Diagnosis not present

## 2022-02-26 DIAGNOSIS — Z961 Presence of intraocular lens: Secondary | ICD-10-CM | POA: Diagnosis not present

## 2022-03-02 DIAGNOSIS — H338 Other retinal detachments: Secondary | ICD-10-CM | POA: Diagnosis not present

## 2022-03-02 DIAGNOSIS — H35371 Puckering of macula, right eye: Secondary | ICD-10-CM | POA: Diagnosis not present

## 2022-03-02 DIAGNOSIS — H35362 Drusen (degenerative) of macula, left eye: Secondary | ICD-10-CM | POA: Diagnosis not present

## 2022-03-02 DIAGNOSIS — Z961 Presence of intraocular lens: Secondary | ICD-10-CM | POA: Diagnosis not present

## 2022-03-02 DIAGNOSIS — H59811 Chorioretinal scars after surgery for detachment, right eye: Secondary | ICD-10-CM | POA: Diagnosis not present

## 2022-03-22 ENCOUNTER — Encounter: Payer: Self-pay | Admitting: Radiology

## 2022-03-23 DIAGNOSIS — E785 Hyperlipidemia, unspecified: Secondary | ICD-10-CM | POA: Diagnosis not present

## 2022-03-29 ENCOUNTER — Other Ambulatory Visit (HOSPITAL_COMMUNITY): Payer: Self-pay | Admitting: Internal Medicine

## 2022-03-29 DIAGNOSIS — S82202D Unspecified fracture of shaft of left tibia, subsequent encounter for closed fracture with routine healing: Secondary | ICD-10-CM | POA: Diagnosis not present

## 2022-03-29 DIAGNOSIS — J302 Other seasonal allergic rhinitis: Secondary | ICD-10-CM | POA: Diagnosis not present

## 2022-03-29 DIAGNOSIS — E785 Hyperlipidemia, unspecified: Secondary | ICD-10-CM | POA: Diagnosis not present

## 2022-03-29 DIAGNOSIS — M810 Age-related osteoporosis without current pathological fracture: Secondary | ICD-10-CM | POA: Diagnosis not present

## 2022-03-29 DIAGNOSIS — Z79899 Other long term (current) drug therapy: Secondary | ICD-10-CM | POA: Diagnosis not present

## 2022-03-29 DIAGNOSIS — S82145D Nondisplaced bicondylar fracture of left tibia, subsequent encounter for closed fracture with routine healing: Secondary | ICD-10-CM | POA: Diagnosis not present

## 2022-03-29 DIAGNOSIS — D0361 Melanoma in situ of right upper limb, including shoulder: Secondary | ICD-10-CM | POA: Diagnosis not present

## 2022-03-29 DIAGNOSIS — R7301 Impaired fasting glucose: Secondary | ICD-10-CM | POA: Diagnosis not present

## 2022-03-29 DIAGNOSIS — Z0000 Encounter for general adult medical examination without abnormal findings: Secondary | ICD-10-CM | POA: Diagnosis not present

## 2022-03-29 DIAGNOSIS — Z1231 Encounter for screening mammogram for malignant neoplasm of breast: Secondary | ICD-10-CM

## 2022-04-03 ENCOUNTER — Other Ambulatory Visit (HOSPITAL_COMMUNITY)
Admission: RE | Admit: 2022-04-03 | Discharge: 2022-04-03 | Disposition: A | Discharge status: Home / Self Care | Payer: HMO | Source: Ambulatory Visit | Attending: Adult Health | Admitting: Adult Health

## 2022-04-03 ENCOUNTER — Ambulatory Visit (INDEPENDENT_AMBULATORY_CARE_PROVIDER_SITE_OTHER): Payer: HMO | Admitting: Adult Health

## 2022-04-03 ENCOUNTER — Encounter: Payer: Self-pay | Admitting: Adult Health

## 2022-04-03 VITALS — BP 136/88 | HR 78 | Ht 68.0 in | Wt 150.0 lb

## 2022-04-03 DIAGNOSIS — N952 Postmenopausal atrophic vaginitis: Secondary | ICD-10-CM | POA: Diagnosis not present

## 2022-04-03 DIAGNOSIS — Z01419 Encounter for gynecological examination (general) (routine) without abnormal findings: Secondary | ICD-10-CM | POA: Diagnosis not present

## 2022-04-03 DIAGNOSIS — Z1211 Encounter for screening for malignant neoplasm of colon: Secondary | ICD-10-CM | POA: Diagnosis not present

## 2022-04-03 LAB — HEMOCCULT GUIAC POC 1CARD (OFFICE): Fecal Occult Blood, POC: NEGATIVE

## 2022-04-03 NOTE — Progress Notes (Signed)
Patient ID: Amy Kelley, female   DOB: 1955/08/23, 67 y.o.   MRN: [id number] History of Present Illness: Amy Kelley is a 67 year old white female, married, PM in for a well woman gyn exam and pap. She recently had area removed right lower arm at dermatology, awaiting results.  PCP is Dr Nevada Crane.   Current Medications, Allergies, Past Medical History, Past Surgical History, Family History and Social History were reviewed in [employer] record.     Review of Systems: Patient denies any headaches, hearing loss, fatigue, blurred vision, shortness of breath, chest pain, abdominal pain, problems with bowel movements, urination, or intercourse(not active). No joint pain or mood swings.  Denies any vaginal bleeding    Physical Exam:BP 136/88 (BP Location: Left Arm, Patient Position: Sitting, Cuff Size: Normal)   Pulse 78   Ht '5\' 8"'$  (1.727 m)   Wt 150 lb (68 kg)   BMI 22.81 kg/m   General:  Well developed, well nourished, no acute distress Skin:  Warm and dry Neck:  Midline trachea, normal thyroid, good ROM, no lymphadenopathy,no carotid bruits heard. Lungs; Clear to auscultation bilaterally Breast:  No dominant palpable mass, retraction, or nipple discharge Cardiovascular: Regular rate and rhythm Abdomen:  Soft, non tender, no hepatosplenomegaly Pelvic:  External genitalia is normal in appearance, no lesions.  The vagina is pale and atrophic. Urethra has no lesions or masses. The cervix is smooth with stenotic os, pap with HR HPV genotyping performed.  Uterus is felt to be normal size, shape, and contour.  No adnexal masses or tenderness noted.Bladder is non tender, no masses felt. Rectal: Good sphincter tone, no polyps, or hemorrhoids felt.  Hemoccult negative. Extremities/musculoskeletal:  No swelling or varicosities noted, no clubbing or cyanosis Psych:  No mood changes, alert and cooperative,seems happy AA is 0 Fall risk is moderate    04/03/2022   10:35 AM 12/14/2020     9:25 AM 09/14/2020    9:18 AM  Depression screen PHQ 2/9  Decreased Interest 0 0 0  Down, Depressed, Hopeless 0 0 0  PHQ - 2 Score 0 0 0  Altered sleeping 1    Tired, decreased energy 0    Change in appetite 0    Feeling bad or failure about yourself  0    Trouble concentrating 0    Moving slowly or fidgety/restless 0    Suicidal thoughts 0    PHQ-9 Score 1         04/03/2022   10:36 AM  GAD 7 : Generalized Anxiety Score  Nervous, Anxious, on Edge 0  Control/stop worrying 0  Worry too much - different things 0  Trouble relaxing 0  Restless 0  Easily annoyed or irritable 0  Afraid - awful might happen 0  Total GAD 7 Score 0      Upstream - 04/03/22 1031       Pregnancy Intention Screening   Does the patient want to become pregnant in the next year? N/A    Does the patient's partner want to become pregnant in the next year? N/A    Would the patient like to discuss contraceptive options today? N/A      Contraception Wrap Up   Current Method Abstinence;No Method - Other Reason   postmenopausal   Reason for No Current Contraceptive Method at Intake ([facility] Only) Other    End Method Abstinence;No Method - Other Reason   postmenopausal   Contraception Counseling Provided No  Examination chaperoned by Levy Pupa LPN  Impression and Plan: 1. Encounter for gynecological examination with Papanicolaou smear of cervix Pap sent Pap in 3 years if normal and can be her last Physical with PCP Labs with PCP Mammogram in July Colonoscopy in 2025 per GI Stay active  - Cytology - PAP( [place])  2. Vaginal atrophy  3. Encounter for screening fecal occult blood testing Hemoccult was negative  - POCT occult blood stool

## 2022-04-05 LAB — CYTOLOGY - PAP
Comment: NEGATIVE
Diagnosis: NEGATIVE
High risk HPV: NEGATIVE

## 2022-04-12 DIAGNOSIS — C44719 Basal cell carcinoma of skin of left lower limb, including hip: Secondary | ICD-10-CM | POA: Diagnosis not present

## 2022-04-12 DIAGNOSIS — D0361 Melanoma in situ of right upper limb, including shoulder: Secondary | ICD-10-CM | POA: Diagnosis not present

## 2022-04-19 DIAGNOSIS — C4361 Malignant melanoma of right upper limb, including shoulder: Secondary | ICD-10-CM | POA: Diagnosis not present

## 2022-05-24 DIAGNOSIS — D225 Melanocytic nevi of trunk: Secondary | ICD-10-CM | POA: Diagnosis not present

## 2022-05-24 DIAGNOSIS — Z85828 Personal history of other malignant neoplasm of skin: Secondary | ICD-10-CM | POA: Diagnosis not present

## 2022-05-24 DIAGNOSIS — Z1283 Encounter for screening for malignant neoplasm of skin: Secondary | ICD-10-CM | POA: Diagnosis not present

## 2022-05-24 DIAGNOSIS — Z85820 Personal history of malignant melanoma of skin: Secondary | ICD-10-CM | POA: Diagnosis not present

## 2022-05-24 DIAGNOSIS — Z08 Encounter for follow-up examination after completed treatment for malignant neoplasm: Secondary | ICD-10-CM | POA: Diagnosis not present

## 2022-05-30 DIAGNOSIS — L905 Scar conditions and fibrosis of skin: Secondary | ICD-10-CM | POA: Diagnosis not present

## 2022-05-30 DIAGNOSIS — L989 Disorder of the skin and subcutaneous tissue, unspecified: Secondary | ICD-10-CM | POA: Diagnosis not present

## 2022-05-30 DIAGNOSIS — C4361 Malignant melanoma of right upper limb, including shoulder: Secondary | ICD-10-CM | POA: Diagnosis not present

## 2022-06-25 ENCOUNTER — Ambulatory Visit (HOSPITAL_COMMUNITY)
Admission: RE | Admit: 2022-06-25 | Discharge: 2022-06-25 | Disposition: A | Discharge status: Home / Self Care | Payer: HMO | Source: Ambulatory Visit | Attending: Internal Medicine | Admitting: Internal Medicine

## 2022-06-25 DIAGNOSIS — Z1231 Encounter for screening mammogram for malignant neoplasm of breast: Secondary | ICD-10-CM

## 2022-07-03 DIAGNOSIS — L821 Other seborrheic keratosis: Secondary | ICD-10-CM | POA: Diagnosis not present

## 2022-07-03 DIAGNOSIS — L905 Scar conditions and fibrosis of skin: Secondary | ICD-10-CM | POA: Diagnosis not present

## 2022-07-09 ENCOUNTER — Telehealth: Payer: Self-pay | Admitting: Adult Health

## 2022-07-09 NOTE — Telephone Encounter (Signed)
Pt is requesting a call. She wants a recommendation for a PC.

## 2022-07-09 NOTE — Telephone Encounter (Signed)
Pt looking for a PCP. I advised [facility], Dr. Durwin Nora or if she'd rather have a female, they have NP's. Pt voiced understanding. JSY

## 2022-08-08 ENCOUNTER — Other Ambulatory Visit: Payer: Self-pay

## 2022-08-30 DIAGNOSIS — Z85828 Personal history of other malignant neoplasm of skin: Secondary | ICD-10-CM | POA: Diagnosis not present

## 2022-08-30 DIAGNOSIS — D225 Melanocytic nevi of trunk: Secondary | ICD-10-CM | POA: Diagnosis not present

## 2022-08-30 DIAGNOSIS — D2271 Melanocytic nevi of right lower limb, including hip: Secondary | ICD-10-CM | POA: Diagnosis not present

## 2022-08-30 DIAGNOSIS — D485 Neoplasm of uncertain behavior of skin: Secondary | ICD-10-CM | POA: Diagnosis not present

## 2022-08-30 DIAGNOSIS — L821 Other seborrheic keratosis: Secondary | ICD-10-CM | POA: Diagnosis not present

## 2022-08-30 DIAGNOSIS — Z08 Encounter for follow-up examination after completed treatment for malignant neoplasm: Secondary | ICD-10-CM | POA: Diagnosis not present

## 2022-08-31 DIAGNOSIS — H35371 Puckering of macula, right eye: Secondary | ICD-10-CM | POA: Diagnosis not present

## 2022-08-31 DIAGNOSIS — Z961 Presence of intraocular lens: Secondary | ICD-10-CM | POA: Diagnosis not present

## 2022-08-31 DIAGNOSIS — H35362 Drusen (degenerative) of macula, left eye: Secondary | ICD-10-CM | POA: Diagnosis not present

## 2022-08-31 DIAGNOSIS — H338 Other retinal detachments: Secondary | ICD-10-CM | POA: Diagnosis not present

## 2022-08-31 DIAGNOSIS — H59811 Chorioretinal scars after surgery for detachment, right eye: Secondary | ICD-10-CM | POA: Diagnosis not present

## 2022-09-25 DIAGNOSIS — E785 Hyperlipidemia, unspecified: Secondary | ICD-10-CM | POA: Diagnosis not present

## 2022-10-01 DIAGNOSIS — S82145D Nondisplaced bicondylar fracture of left tibia, subsequent encounter for closed fracture with routine healing: Secondary | ICD-10-CM | POA: Diagnosis not present

## 2022-10-01 DIAGNOSIS — E875 Hyperkalemia: Secondary | ICD-10-CM | POA: Diagnosis not present

## 2022-10-01 DIAGNOSIS — J302 Other seasonal allergic rhinitis: Secondary | ICD-10-CM | POA: Diagnosis not present

## 2022-10-01 DIAGNOSIS — M810 Age-related osteoporosis without current pathological fracture: Secondary | ICD-10-CM | POA: Diagnosis not present

## 2022-10-01 DIAGNOSIS — R7301 Impaired fasting glucose: Secondary | ICD-10-CM | POA: Diagnosis not present

## 2022-10-01 DIAGNOSIS — E785 Hyperlipidemia, unspecified: Secondary | ICD-10-CM | POA: Diagnosis not present

## 2022-10-01 DIAGNOSIS — X58XXXD Exposure to other specified factors, subsequent encounter: Secondary | ICD-10-CM | POA: Diagnosis not present

## 2022-10-01 DIAGNOSIS — D0361 Melanoma in situ of right upper limb, including shoulder: Secondary | ICD-10-CM | POA: Diagnosis not present

## 2022-10-04 DIAGNOSIS — D485 Neoplasm of uncertain behavior of skin: Secondary | ICD-10-CM | POA: Diagnosis not present

## 2022-10-04 DIAGNOSIS — L988 Other specified disorders of the skin and subcutaneous tissue: Secondary | ICD-10-CM | POA: Diagnosis not present

## 2022-12-06 DIAGNOSIS — Z1283 Encounter for screening for malignant neoplasm of skin: Secondary | ICD-10-CM | POA: Diagnosis not present

## 2022-12-06 DIAGNOSIS — Z08 Encounter for follow-up examination after completed treatment for malignant neoplasm: Secondary | ICD-10-CM | POA: Diagnosis not present

## 2022-12-06 DIAGNOSIS — Z86006 Personal history of melanoma in-situ: Secondary | ICD-10-CM | POA: Diagnosis not present

## 2022-12-06 DIAGNOSIS — D225 Melanocytic nevi of trunk: Secondary | ICD-10-CM | POA: Diagnosis not present

## 2022-12-06 DIAGNOSIS — L820 Inflamed seborrheic keratosis: Secondary | ICD-10-CM | POA: Diagnosis not present

## 2022-12-06 DIAGNOSIS — L708 Other acne: Secondary | ICD-10-CM | POA: Diagnosis not present

## 2023-03-07 DIAGNOSIS — Z961 Presence of intraocular lens: Secondary | ICD-10-CM | POA: Diagnosis not present

## 2023-03-07 DIAGNOSIS — H33051 Total retinal detachment, right eye: Secondary | ICD-10-CM | POA: Diagnosis not present

## 2023-03-07 DIAGNOSIS — H18593 Other hereditary corneal dystrophies, bilateral: Secondary | ICD-10-CM | POA: Diagnosis not present

## 2023-03-07 DIAGNOSIS — H35412 Lattice degeneration of retina, left eye: Secondary | ICD-10-CM | POA: Diagnosis not present

## 2023-03-14 DIAGNOSIS — Z85820 Personal history of malignant melanoma of skin: Secondary | ICD-10-CM | POA: Diagnosis not present

## 2023-03-14 DIAGNOSIS — D3615 Benign neoplasm of peripheral nerves and autonomic nervous system of abdomen: Secondary | ICD-10-CM | POA: Diagnosis not present

## 2023-03-14 DIAGNOSIS — D214 Benign neoplasm of connective and other soft tissue of abdomen: Secondary | ICD-10-CM | POA: Diagnosis not present

## 2023-03-14 DIAGNOSIS — Z1283 Encounter for screening for malignant neoplasm of skin: Secondary | ICD-10-CM | POA: Diagnosis not present

## 2023-03-14 DIAGNOSIS — D225 Melanocytic nevi of trunk: Secondary | ICD-10-CM | POA: Diagnosis not present

## 2023-03-14 DIAGNOSIS — L708 Other acne: Secondary | ICD-10-CM | POA: Diagnosis not present

## 2023-03-14 DIAGNOSIS — Z08 Encounter for follow-up examination after completed treatment for malignant neoplasm: Secondary | ICD-10-CM | POA: Diagnosis not present

## 2023-03-14 DIAGNOSIS — L820 Inflamed seborrheic keratosis: Secondary | ICD-10-CM | POA: Diagnosis not present

## 2023-06-02 IMAGING — MG MM DIGITAL DIAGNOSTIC UNILAT*R* W/ TOMO W/ CAD
6 series · 6 of 18 positions shown · non-contrast
Comparison: Previous exam(s).

CLINICAL DATA: Possible mass in the lower outer right breast on a
recent screening mammogram.

EXAM:
DIGITAL DIAGNOSTIC UNILATERAL RIGHT MAMMOGRAM WITH TOMOSYNTHESIS AND
CAD
TECHNIQUE: Right digital diagnostic mammography and breast tomosynthesis was
performed. The images were evaluated with computer-aided detection.

[R CC synth-2D (1 of 2)]
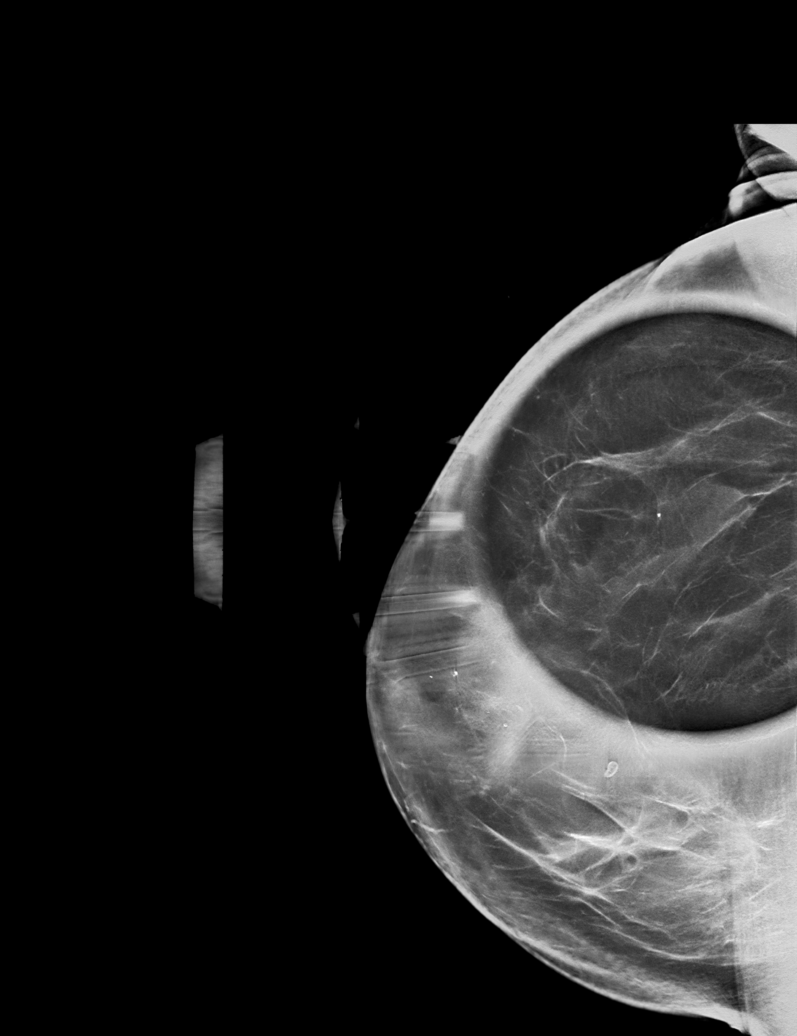

[R MLO synth-2D]
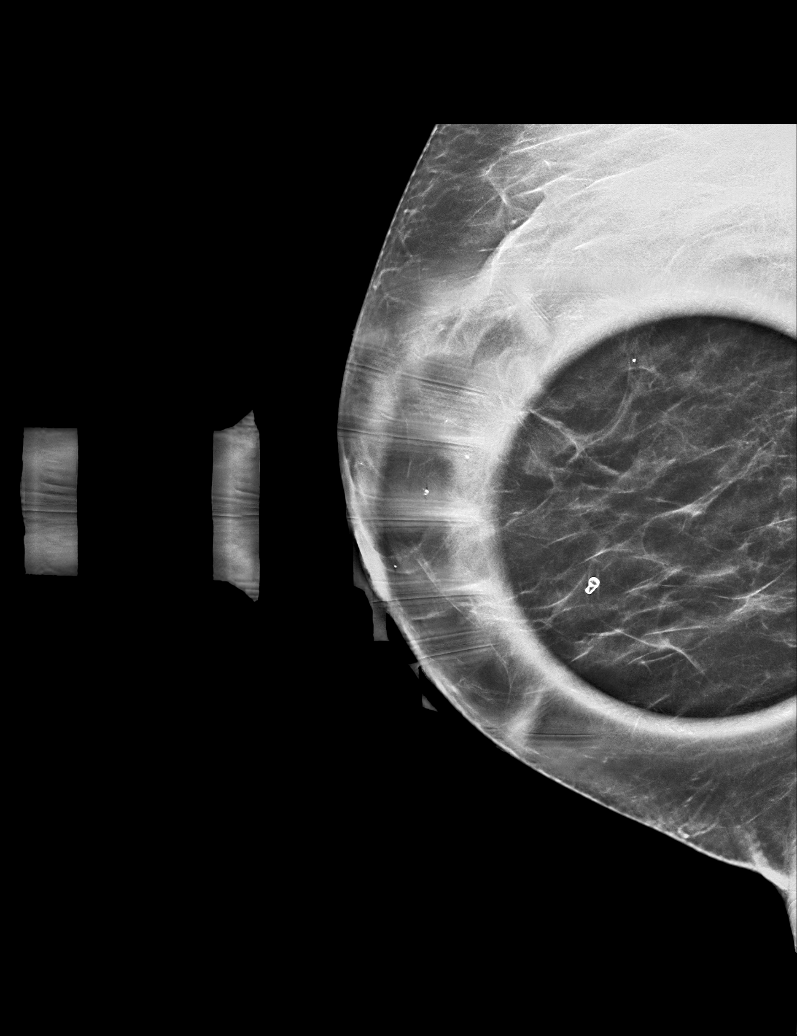

[R CC synth-2D (2 of 2)]
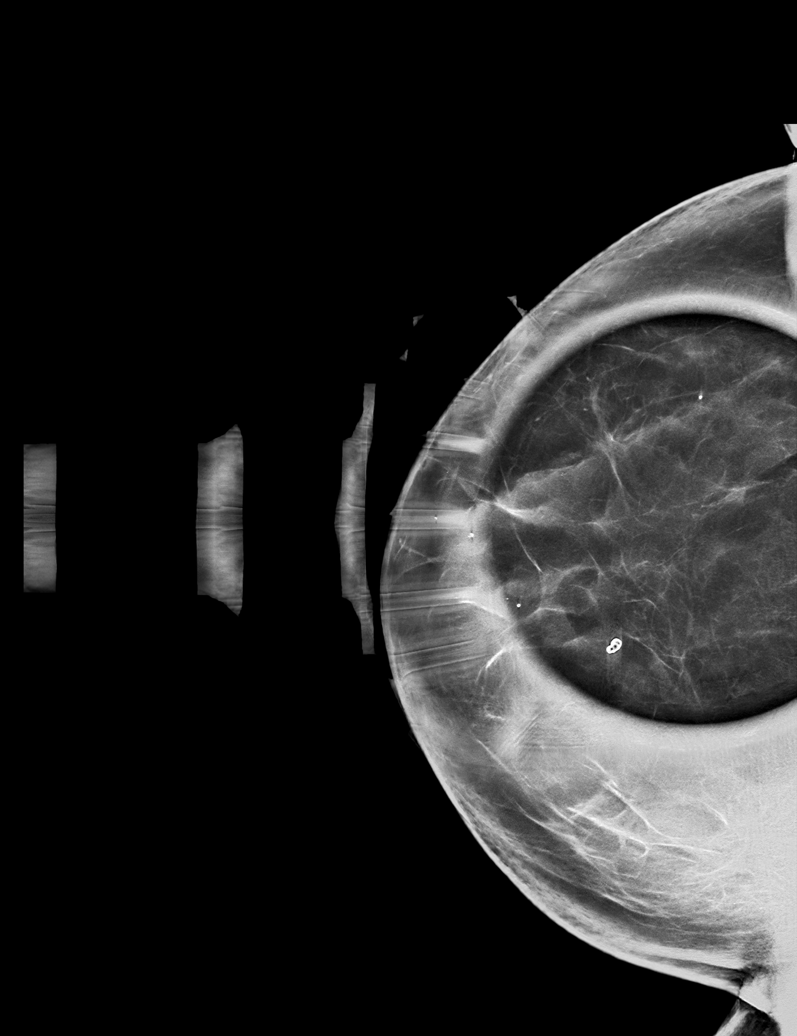

[R CC tomo (1 of 2) · tomo slice 39/78.0]
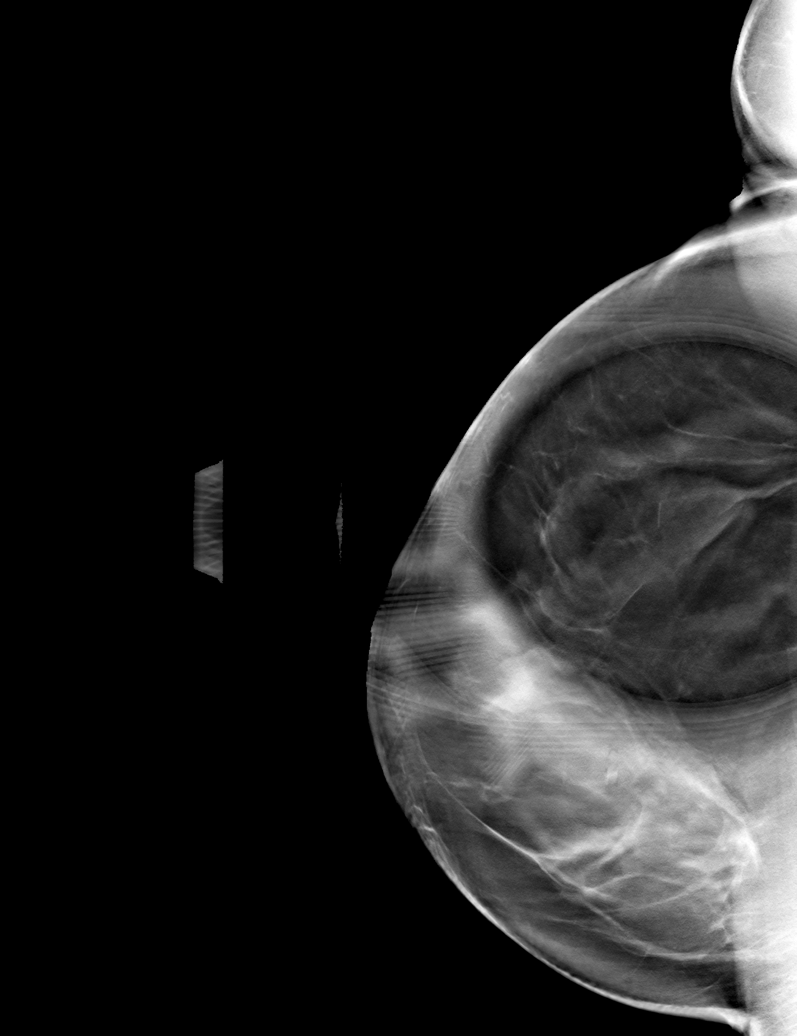

[R CC tomo (2 of 2) · tomo slice 33/65.0]
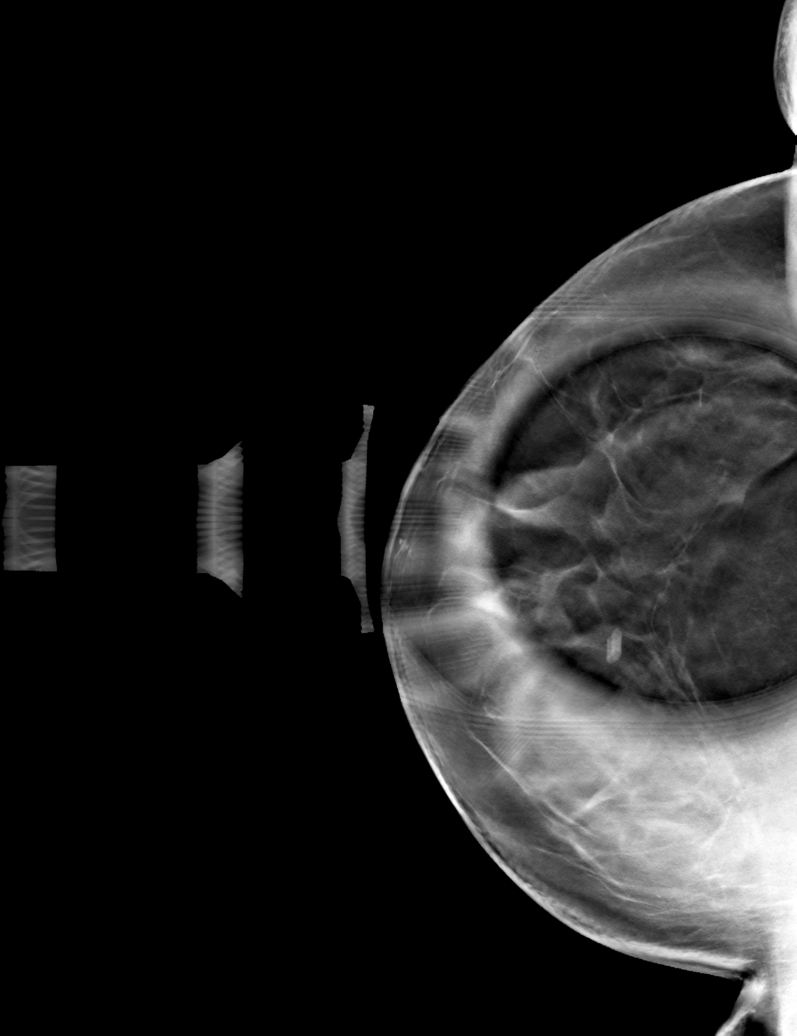

[R MLO tomo · tomo slice 26/51.0]
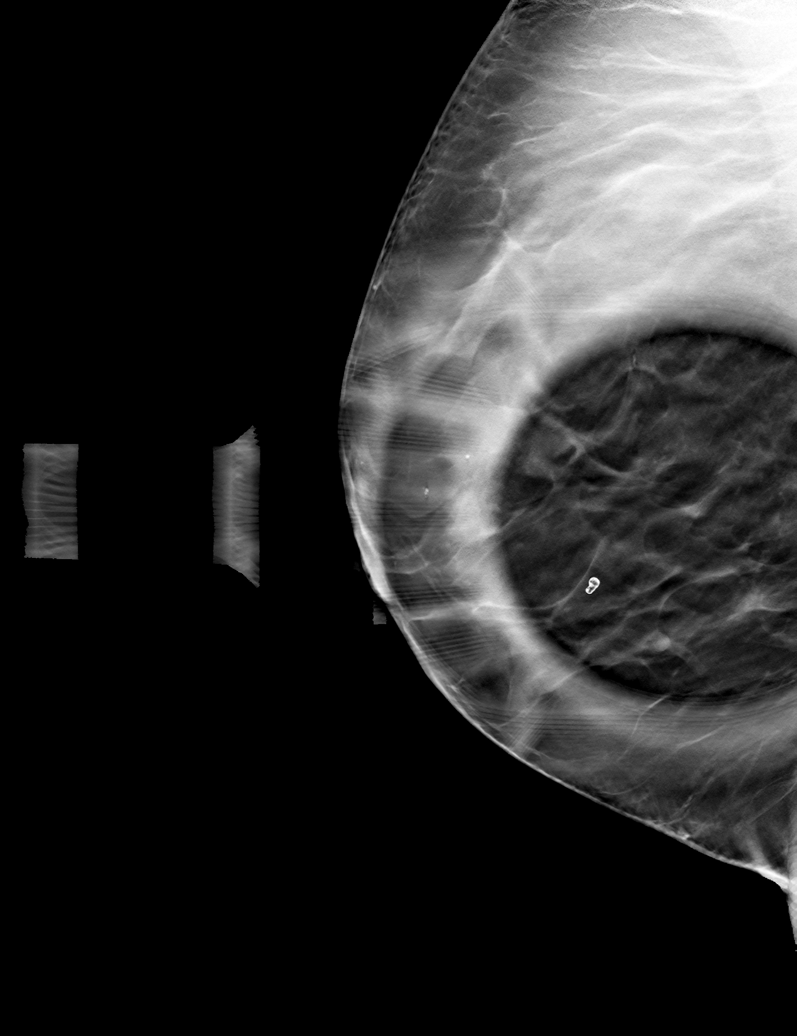

[6 of 18 positions shown; findings below may reference images not displayed]

ACR Breast Density Category c: The breast tissue is heterogeneously
dense, which may obscure small masses.
FINDINGS: 3D tomographic and 2D generated spot compression images of the right
breast demonstrate normal fibroglandular tissue at the location of
the recently suspected mass, unchanged compared previous
examinations.
IMPRESSION: No evidence of malignancy. The recently suspected right breast mass
was close apposition of normal breast tissue.

RECOMMENDATION:
Bilateral screening mammogram in 1 year when due.

I have discussed the findings and recommendations with the patient.
If applicable, a reminder letter will be sent to the patient
regarding the next appointment.

BI-RADS CATEGORY  1: Negative.

## 2023-07-01 ENCOUNTER — Ambulatory Visit (HOSPITAL_COMMUNITY)
Admission: RE | Admit: 2023-07-01 | Discharge: 2023-07-01 | Disposition: A | Discharge status: Home / Self Care | Source: Ambulatory Visit | Attending: Physician Assistant | Admitting: Physician Assistant

## 2023-07-01 ENCOUNTER — Encounter: Payer: Self-pay | Admitting: Physician Assistant

## 2023-07-01 ENCOUNTER — Ambulatory Visit: Admitting: Physician Assistant

## 2023-07-01 VITALS — BP 110/80 | HR 78 | Temp 98.1°F | Ht 68.0 in | Wt 156.0 lb

## 2023-07-01 DIAGNOSIS — M79671 Pain in right foot: Secondary | ICD-10-CM | POA: Diagnosis not present

## 2023-07-01 DIAGNOSIS — Z7689 Persons encountering health services in other specified circumstances: Secondary | ICD-10-CM

## 2023-07-01 DIAGNOSIS — M19071 Primary osteoarthritis, right ankle and foot: Secondary | ICD-10-CM | POA: Diagnosis not present

## 2023-07-01 NOTE — Progress Notes (Signed)
 New Patient Office Visit  Subjective    Patient ID: Amy Kelley, female    DOB: April 18, 1955  Age: 68 y.o. MRN: [id number]  CC:  Chief Complaint  Patient presents with   Establish Care    Right heel pain 2 months    HPI Amy Kelley presents to establish care  Patient presents today with past medical history significant for osteoporosis, vitamin d  deficiency, and hyperlipidemia. She is not currently taking prescription medication. She complains of right heel pain x 2 months today. She reports pain is worse with direct pressure or first thing in the morning. She has not been taking OTC medication for pain relief.   Outpatient Encounter Medications as of 07/01/2023  Medication Sig   ASHWAGANDHA PO Take by mouth. 1,300   b complex vitamins capsule Take 1 capsule by mouth daily.   Boron 3 MG CAPS Take by mouth.   calcium-vitamin D  250-100 MG-UNIT tablet Take 1 tablet by mouth daily.   Cholecalciferol (VITAMIN D -3) 125 MCG (5000 UT) TABS Take 5,000 Units by mouth daily at 12 noon.   Ginkgo Biloba (GNP GINGKO BILOBA EXTRACT PO) Take by mouth.   Lysine 1000 MG TABS Take 1 tablet by mouth daily.   Magnesium 250 MG TABS Take by mouth.   Menaquinone-7 (VITAMIN K2) 100 MCG CAPS Take 1 capsule by mouth daily.   OVER THE COUNTER MEDICATION L- Carnitine 500 mg - heart health   vitamin C (ASCORBIC ACID) 500 MG tablet Take 60 mg by mouth daily. Collagen 2,500   [DISCONTINUED] Magnesium 100 MG TABS Take 1 tablet by mouth in the morning.   [DISCONTINUED] Multiple Vitamin (MULTIVITAMIN) tablet Take 1 tablet by mouth daily.   [DISCONTINUED] Multiple Vitamins-Minerals (ZINC PO) Take by mouth.   No facility-administered encounter medications on file as of 07/01/2023.    Past Medical History:  Diagnosis Date   Arthritis    Basal cell carcinoma 2007   left lower medial leg   Cancer (HCC)    skin cancer    Headache    migraines   Hematuria 06/30/2013   HLD (hyperlipidemia) 12/10/2018    Osteoporosis 12/10/2018   PMB (postmenopausal bleeding) 05/02/2015   Vaginal atrophy 06/30/2013   Vitamin D  deficiency disease 12/10/2018    Past Surgical History:  Procedure Laterality Date   CESAREAN SECTION     COLONOSCOPY N/A 10/15/2013   Procedure: COLONOSCOPY;  Surgeon: Ruby Corporal, MD;  Location: [facility];  Service: Endoscopy;  Laterality: N/A;  930   PARS PLANA VITRECTOMY Right 05/26/2016   Procedure: PARS PLANA VITRECTOMY WITH ENDO LASER, SF6 INJECTION;  Surgeon: Jearline Minder, MD;  Location: [facility] OR;  Service: Ophthalmology;  Laterality: Right;   skin cancer removal     on left leg about 5 years ago or more.   VEIN SURGERY  2022   By [facility]    Family History  Problem Relation Age of Onset   Alzheimer's disease Father    Cancer Sister        ovarian, pancreatic   Epilepsy Sister    Heart attack Maternal Grandfather    Osteoporosis Mother     Social History   Socioeconomic History   Marital status: Married    Spouse name: Not on file   Number of children: Not on file   Years of education: Not on file   Highest education level: Bachelor's degree (e.g., BA, AB, BS)  Occupational History   Not on file  Tobacco Use  Smoking status: Never   Smokeless tobacco: Never  Vaping Use   Vaping status: Never Used  Substance and Sexual Activity   Alcohol use: No   Drug use: No   Sexual activity: Not Currently    Birth control/protection: Post-menopausal  Other Topics Concern   Not on file  Social History Narrative   Married 40 years.Printmaker with elections.   Social Drivers of Health   Financial Resource Strain: Medium Risk (06/28/2023)   Overall Financial Resource Strain (CARDIA)    Difficulty of Paying Living Expenses: Somewhat hard  Food Insecurity: No Food Insecurity (06/28/2023)   Hunger Vital Sign    Worried About Running Out of Food in the Last Year: Never true    Ran Out of Food in the Last Year: Never true  Transportation Needs: No  Transportation Needs (06/28/2023)   PRAPARE - Administrator, Civil Service (Medical): No    Lack of Transportation (Non-Medical): No  Physical Activity: Insufficiently Active (06/28/2023)   Exercise Vital Sign    Days of Exercise per Week: 2 days    Minutes of Exercise per Session: 50 min  Stress: No Stress Concern Present (06/28/2023)   [employer] of Occupational Health - Occupational Stress Questionnaire    Feeling of Stress : Only a little  Social Connections: Moderately Integrated (06/28/2023)   Social Connection and Isolation Panel [NHANES]    Frequency of Communication with Friends and Family: Once a week    Frequency of Social Gatherings with Friends and Family: Once a week    Attends Religious Services: More than 4 times per year    Active Member of [employer] or Organizations: Yes    Attends Engineer, structural: More than 4 times per year    Marital Status: Married  Catering manager Violence: Not At Risk (04/03/2022)   Humiliation, Afraid, Rape, and Kick questionnaire    Fear of Current or Ex-Partner: No    Emotionally Abused: No    Physically Abused: No    Sexually Abused: No    Review of Systems  Constitutional:  Negative for fever, malaise/fatigue and weight loss.  Cardiovascular:  Negative for chest pain and palpitations.  Musculoskeletal:  Negative for back pain, falls, joint pain, myalgias and neck pain.  Neurological:  Negative for tingling, sensory change and headaches.  Endo/Heme/Allergies:  Does not bruise/bleed easily.     Objective    BP 110/80   Pulse 78   Temp 98.1 F (36.7 C)   Ht 5\' 8"  (1.727 m)   Wt 156 lb (70.8 kg)   SpO2 96%   BMI 23.72 kg/m   Physical Exam Constitutional:      Appearance: Normal appearance. She is normal weight.  HENT:     Head: Normocephalic and atraumatic.     Nose: Nose normal.     Mouth/Throat:     Mouth: Mucous membranes are moist.     Pharynx: Oropharynx is clear.  Eyes:     Extraocular  Movements: Extraocular movements intact.     Conjunctiva/sclera: Conjunctivae normal.  Cardiovascular:     Rate and Rhythm: Normal rate and regular rhythm.     Heart sounds: Normal heart sounds. No murmur heard. Pulmonary:     Breath sounds: Normal breath sounds.  Musculoskeletal:     Right foot: Normal range of motion. Tenderness (plantar aspect of heel) present. No deformity or foot drop.  Feet:     Right foot:     Skin integrity: Skin integrity  normal.  Neurological:     Mental Status: She is alert.       Assessment & Plan:  Encounter to establish care  Pain of right heel Assessment & Plan: Patient presents today with 2 months of right heel pain. Overall benign exam, mild tenderness over plantar aspect of heel. Low suspicion for acute injury or fracture. Symptoms most consistent with calcaneal spur vs plantar fasciitis. Xray of right foot today to evaluate for bone spur. Handout given regarding supportive treatment for plantar fasciitis and heel spurs. Advised ibuprofen and tylenol as needed for pain. Will place referral to podiatry for management of calcaneal spur pending x-ray results. Patient to follow up for worsening symptoms, or in 3 months for annual physical.   Orders: -     DG Foot Complete Right    Return in about 3 months (around 10/01/2023) for phys .   Jearlean Mince Mackay Hanauer, PA-C

## 2023-07-01 NOTE — Assessment & Plan Note (Signed)
 Patient presents today with 2 months of right heel pain. Overall benign exam, mild tenderness over plantar aspect of heel. Low suspicion for acute injury or fracture. Symptoms most consistent with calcaneal spur vs plantar fasciitis. Xray of right foot today to evaluate for bone spur. Handout given regarding supportive treatment for plantar fasciitis and heel spurs. Advised ibuprofen and tylenol as needed for pain. Will place referral to podiatry for management of calcaneal spur pending x-ray results. Patient to follow up for worsening symptoms, or in 3 months for annual physical.

## 2023-07-02 ENCOUNTER — Other Ambulatory Visit: Payer: Self-pay | Admitting: Physician Assistant

## 2023-07-02 ENCOUNTER — Ambulatory Visit: Payer: Self-pay | Admitting: Physician Assistant

## 2023-07-02 DIAGNOSIS — M7731 Calcaneal spur, right foot: Secondary | ICD-10-CM

## 2023-07-18 DIAGNOSIS — Z08 Encounter for follow-up examination after completed treatment for malignant neoplasm: Secondary | ICD-10-CM | POA: Diagnosis not present

## 2023-07-18 DIAGNOSIS — Z1283 Encounter for screening for malignant neoplasm of skin: Secondary | ICD-10-CM | POA: Diagnosis not present

## 2023-07-18 DIAGNOSIS — D225 Melanocytic nevi of trunk: Secondary | ICD-10-CM | POA: Diagnosis not present

## 2023-07-18 DIAGNOSIS — L905 Scar conditions and fibrosis of skin: Secondary | ICD-10-CM | POA: Diagnosis not present

## 2023-07-18 DIAGNOSIS — Z85820 Personal history of malignant melanoma of skin: Secondary | ICD-10-CM | POA: Diagnosis not present

## 2023-07-22 ENCOUNTER — Ambulatory Visit: Admitting: Podiatry

## 2023-07-22 ENCOUNTER — Encounter: Payer: Self-pay | Admitting: Podiatry

## 2023-07-22 DIAGNOSIS — M722 Plantar fascial fibromatosis: Secondary | ICD-10-CM | POA: Diagnosis not present

## 2023-07-22 DIAGNOSIS — M7731 Calcaneal spur, right foot: Secondary | ICD-10-CM

## 2023-07-22 MED ORDER — MELOXICAM 7.5 MG PO TABS: 7.5000 mg | ORAL_TABLET | Freq: Every day | ORAL | 0 refills | Status: DC | PRN

## 2023-07-22 NOTE — Patient Instructions (Signed)

## 2023-07-24 NOTE — Progress Notes (Signed)
  Subjective:  Patient ID: Amy Kelley, female    DOB: Jan 24, 1955,  MRN: [id number]  Chief Complaint  Patient presents with   Foot Pain    RM# 11 Right heel pain from bone spur referred by PCP.    Discussed the use of AI scribe software for clinical note transcription with the patient, who gave verbal consent to proceed.  History of Present Illness Amy Kelley is a 68 year old female who presents with right heel pain.  She experiences pain at the bottom of her right heel, which has intensified recently. The pain is severe in the mornings or after resting, making walking difficult. There are no recent injuries or swelling in the heel area. Pain is present when pressure is applied to the bottom of the heel, but not with pressure on the Achilles tendon. There are no issues with the arch of her foot.  She recalls similar heel pain years ago that resolved spontaneously. The current episode has improved slightly since onset, possibly due to reduced physical activity. She has not used any medications for the heel pain but is open to taking ibuprofen if needed. No medication allergies.      Objective:    Physical Exam General: AAO x3, NAD  Dermatological: Skin is warm, dry and supple bilateral.  There are no open sores, no preulcerative lesions, no rash or signs of infection present.  Vascular: Dorsalis Pedis artery and Posterior Tibial artery pedal pulses are 2/4 bilateral with immedate capillary fill time.  There is no pain with calf compression, swelling, warmth, erythema.   Neruologic: Grossly intact via light touch bilateral.  Negative Tinel's sign.  Musculoskeletal: There is tenderness palpation at this portion Achilles tendon insertion of the calcaneus with the majority tenderness is along the lines on the plantar aspect of the heel insertion of plantar fascia.  There is no pain with lateral compression of calcaneus.  There is no edema, erythema.  No pain in the arch of the  foot.  No area pinpoint tenderness otherwise.  No edema.  Gait: Unassisted, Nonantalgic.         Results RADIOLOGY Independently reviewed the prior x-ray.   Assessment:   1. Plantar fasciitis   2. Heel spur, right      Plan:  Patient was evaluated and treated and all questions answered.  Assessment and Plan Assessment & Plan Plantar fasciitis Chronic plantar fasciitis with recent exacerbation in the right heel.  - Independently review of prior x-rays. - Advise daily icing of the heel using an ice pack or frozen water  bottle rolled under the foot. - Prescribe meloxicam for inflammation, to be taken once daily as needed. - Provide a worksheet with exercises to stretch the calf muscle, Achilles tendon, and plantar fascia. - Recommend wearing a plantar fascial brace over socks inside shoes during the day to support the heel. - Advise against going barefoot; recommend wearing shoes with good arch support. - Submit request to insurance for custom insoles coverage. - Advise modification of activities to avoid high-impact exercises like jumping in Zumba. - Recommend wearing moccasins or shoes with arch support around the house.  No follow-ups on file.  Donnice JONELLE Fees DPM

## 2023-08-27 DIAGNOSIS — I83893 Varicose veins of bilateral lower extremities with other complications: Secondary | ICD-10-CM | POA: Diagnosis not present

## 2023-08-27 DIAGNOSIS — M79662 Pain in left lower leg: Secondary | ICD-10-CM | POA: Diagnosis not present

## 2023-08-27 DIAGNOSIS — M79661 Pain in right lower leg: Secondary | ICD-10-CM | POA: Diagnosis not present

## 2023-08-27 DIAGNOSIS — M7989 Other specified soft tissue disorders: Secondary | ICD-10-CM | POA: Diagnosis not present

## 2023-08-27 DIAGNOSIS — R600 Localized edema: Secondary | ICD-10-CM | POA: Diagnosis not present

## 2023-08-27 DIAGNOSIS — I83813 Varicose veins of bilateral lower extremities with pain: Secondary | ICD-10-CM | POA: Diagnosis not present

## 2023-08-29 ENCOUNTER — Encounter: Payer: Self-pay | Admitting: Physician Assistant

## 2023-08-30 DIAGNOSIS — H35371 Puckering of macula, right eye: Secondary | ICD-10-CM | POA: Diagnosis not present

## 2023-08-30 DIAGNOSIS — H35362 Drusen (degenerative) of macula, left eye: Secondary | ICD-10-CM | POA: Diagnosis not present

## 2023-08-30 DIAGNOSIS — Z8669 Personal history of other diseases of the nervous system and sense organs: Secondary | ICD-10-CM | POA: Diagnosis not present

## 2023-08-30 DIAGNOSIS — H59811 Chorioretinal scars after surgery for detachment, right eye: Secondary | ICD-10-CM | POA: Diagnosis not present

## 2023-09-18 DIAGNOSIS — I872 Venous insufficiency (chronic) (peripheral): Secondary | ICD-10-CM | POA: Diagnosis not present

## 2023-09-24 DIAGNOSIS — I83891 Varicose veins of right lower extremities with other complications: Secondary | ICD-10-CM | POA: Diagnosis not present

## 2023-09-24 DIAGNOSIS — I83812 Varicose veins of left lower extremities with pain: Secondary | ICD-10-CM | POA: Diagnosis not present

## 2023-09-24 DIAGNOSIS — Z09 Encounter for follow-up examination after completed treatment for conditions other than malignant neoplasm: Secondary | ICD-10-CM | POA: Diagnosis not present

## 2023-09-25 DIAGNOSIS — I83892 Varicose veins of left lower extremities with other complications: Secondary | ICD-10-CM | POA: Diagnosis not present

## 2023-10-01 ENCOUNTER — Ambulatory Visit (INDEPENDENT_AMBULATORY_CARE_PROVIDER_SITE_OTHER): Admitting: Physician Assistant

## 2023-10-01 ENCOUNTER — Encounter: Payer: Self-pay | Admitting: Physician Assistant

## 2023-10-01 VITALS — BP 123/80 | HR 68 | Temp 97.5°F | Ht 68.0 in | Wt 152.0 lb

## 2023-10-01 DIAGNOSIS — Z0000 Encounter for general adult medical examination without abnormal findings: Secondary | ICD-10-CM | POA: Diagnosis not present

## 2023-10-01 DIAGNOSIS — Z1211 Encounter for screening for malignant neoplasm of colon: Secondary | ICD-10-CM | POA: Diagnosis not present

## 2023-10-01 DIAGNOSIS — Z13220 Encounter for screening for lipoid disorders: Secondary | ICD-10-CM

## 2023-10-01 DIAGNOSIS — Z1231 Encounter for screening mammogram for malignant neoplasm of breast: Secondary | ICD-10-CM | POA: Diagnosis not present

## 2023-10-01 NOTE — Progress Notes (Signed)
 Complete physical exam  Patient: Amy Kelley   DOB: Sep 15, 1955   68 y.o. Female  MRN: [id number]  Subjective:    Chief Complaint  Patient presents with   Annual Exam    Patient request physical not medicare wellness    Amy Kelley is a 68 y.o. female who presents today for a complete physical exam. She reports consuming a general diet. She participates in group exercise classes at the [facility] approximately 2 times per week. She has a goal to increase daily walking as well. She generally feels well. She reports sleeping poorly. She does not have additional problems to discuss today.    Most recent fall risk assessment:    10/01/2023    9:48 AM  Fall Risk   Falls in the past year? 0  Number falls in past yr: 0  Injury with Fall? 0  Risk for fall due to : No Fall Risks  Follow up Falls evaluation completed     Most recent depression screenings:    10/01/2023    9:48 AM 04/03/2022   10:35 AM  PHQ 2/9 Scores  PHQ - 2 Score 0 0  PHQ- 9 Score 0 1    Vision:Within last year and Dental: No current dental problems and Receives regular dental care  Patient Care Team: Zakar Brosch, [place], [place] as PCP - General (Physician Assistant) Livingston Rigg, MD as Consulting Physician (Dermatology)   Outpatient Medications Prior to Visit  Medication Sig   ASHWAGANDHA PO Take by mouth. 1,300   b complex vitamins capsule Take 1 capsule by mouth daily.   Boron 3 MG CAPS Take by mouth.   calcium-vitamin D  250-100 MG-UNIT tablet Take 1 tablet by mouth daily.   Cholecalciferol (VITAMIN D -3) 125 MCG (5000 UT) TABS Take 5,000 Units by mouth daily at 12 noon.   Ginkgo Biloba (GNP GINGKO BILOBA EXTRACT PO) Take by mouth.   Lysine 1000 MG TABS Take 1 tablet by mouth daily.   Magnesium 250 MG TABS Take by mouth.   Menaquinone-7 (VITAMIN K2) 100 MCG CAPS Take 1 capsule by mouth daily.   OVER THE COUNTER MEDICATION L- Carnitine 500 mg - heart health   vitamin C (ASCORBIC ACID) 500 MG tablet Take 60 mg  by mouth daily. Collagen 2,500   [DISCONTINUED] meloxicam  (MOBIC ) 7.5 MG tablet Take 1 tablet (7.5 mg total) by mouth daily as needed for pain.   No facility-administered medications prior to visit.    Review of Systems  Constitutional:  Negative for chills, fever and malaise/fatigue.  Eyes:  Negative for blurred vision and double vision.  Respiratory:  Negative for cough and shortness of breath.   Cardiovascular:  Negative for chest pain and palpitations.  Musculoskeletal:  Negative for joint pain and myalgias.  Neurological:  Negative for dizziness and headaches.  Psychiatric/Behavioral:  Negative for depression. The patient is not nervous/anxious.        Objective:     BP 123/80   Pulse 68   Temp (!) 97.5 F (36.4 C)   Ht 5' 8 (1.727 m)   Wt 152 lb (68.9 kg)   SpO2 98%   BMI 23.11 kg/m   Physical Exam Constitutional:      General: She is not in acute distress.    Appearance: Normal appearance. She is normal weight. She is not ill-appearing.  HENT:     Head: Normocephalic and atraumatic.     Mouth/Throat:     Mouth: Mucous membranes are moist.  Pharynx: Oropharynx is clear.  Eyes:     Extraocular Movements: Extraocular movements intact.     Conjunctiva/sclera: Conjunctivae normal.  Cardiovascular:     Rate and Rhythm: Normal rate and regular rhythm.     Heart sounds: Normal heart sounds. No murmur heard. Pulmonary:     Effort: Pulmonary effort is normal.     Breath sounds: Normal breath sounds. No wheezing or rales.  Musculoskeletal:     Right lower leg: No edema.     Left lower leg: No edema.  Skin:    General: Skin is warm and dry.  Neurological:     General: No focal deficit present.     Mental Status: She is alert and oriented to person, place, and time.  Psychiatric:        Mood and Affect: Mood normal.        Behavior: Behavior normal.      No results found for any visits on 10/01/23.    Assessment & Plan:    Routine Health Maintenance and  Physical Exam  Health Maintenance  Topic Date Due   DTaP/Tdap/Td vaccine (1 - Tdap) Never done   Zoster (Shingles) Vaccine (1 of 2) Never done   Pneumococcal Vaccine for age over 57 (1 of 1 - PCV) 10/10/2004   COVID-19 Vaccine (3 - Moderna risk series) 05/26/2019   Medicare Annual Wellness Visit  03/29/2023   Colon Cancer Screening  10/16/2023   Flu Shot  04/21/2024*   Mammogram  06/24/2024   DEXA scan (bone density measurement)  Completed   Hepatitis C Screening  Completed   HPV Vaccine  Aged Out   Meningitis B Vaccine  Aged Out  *Topic was postponed. The date shown is not the original due date.    Discussed health benefits of physical activity, and encouraged her to engage in regular exercise appropriate for her age and condition.  Problem List Items Addressed This Visit   None Visit Diagnoses       Annual visit for general adult medical examination without abnormal findings    -  Primary   Relevant Orders   CMP14+EGFR   CBC with Differential/Platelet     Screening for lipid disorders       Relevant Orders   Lipid panel     Encounter for screening mammogram for malignant neoplasm of breast       Relevant Orders   MM 3D SCREENING MAMMOGRAM BILATERAL BREAST     Screen for colon cancer       Relevant Orders   Cologuard      Return in about 1 year (around 09/30/2024) for wellness .  Safety measures discussed. Immunizations reviewed: declined influenza vaccine today.   Diet and exercise/ lifestyle modifications discussed. Recommend 150 minutes per week of exercise such as walking. Recommend lots of fresh produce to include fruits, vegetables, beans, healthy fats such as avocado, nuts, seeds, and 3-6 ounces of protein at each meal.  Avoid fried foods and fast food. Limit alcohol consumption: no more than one drink per day for women and 2 drinks per day for men.  Stress management discussed. Routine vision and dental screening discussed: recommend dentist every 6 months, gets  vision checked every 1-2 years.  Health maintenance: Cologuard ordered today. Mammogram ordered today.  Questions answered.       Charmaine Kemari Mares, PA-C

## 2023-10-02 ENCOUNTER — Encounter (HOSPITAL_COMMUNITY): Payer: Self-pay

## 2023-10-02 ENCOUNTER — Ambulatory Visit (HOSPITAL_COMMUNITY)
Admission: RE | Admit: 2023-10-02 | Discharge: 2023-10-02 | Disposition: A | Discharge status: Home / Self Care | Source: Ambulatory Visit | Attending: Physician Assistant | Admitting: Physician Assistant

## 2023-10-02 DIAGNOSIS — Z1231 Encounter for screening mammogram for malignant neoplasm of breast: Secondary | ICD-10-CM | POA: Insufficient documentation

## 2023-10-02 LAB — CMP14+EGFR
ALT: 9 IU/L (ref 0–32)
AST: 20 IU/L (ref 0–40)
Albumin: 4.6 g/dL (ref 3.9–4.9)
Alkaline Phosphatase: 110 IU/L (ref 44–121)
BUN/Creatinine Ratio: 13 (ref 12–28)
BUN: 11 mg/dL (ref 8–27)
Bilirubin Total: 0.4 mg/dL (ref 0.0–1.2)
CO2: 24 mmol/L (ref 20–29)
Calcium: 10.2 mg/dL (ref 8.7–10.3)
Chloride: 103 mmol/L (ref 96–106)
Creatinine, Ser: 0.86 mg/dL (ref 0.57–1.00)
Globulin, Total: 2.4 g/dL (ref 1.5–4.5)
Glucose: 91 mg/dL (ref 70–99)
Potassium: 5.5 mmol/L — ABNORMAL HIGH (ref 3.5–5.2)
Sodium: 141 mmol/L (ref 134–144)
Total Protein: 7 g/dL (ref 6.0–8.5)
eGFR: 74 mL/min/1.73 (ref >59)

## 2023-10-02 LAB — CBC WITH DIFFERENTIAL/PLATELET
Basophils Absolute: 0 x10E3/uL (ref 0.0–0.2)
Basos: 1 %
EOS (ABSOLUTE): 0.2 x10E3/uL (ref 0.0–0.4)
Eos: 4 %
Hematocrit: 46.9 % — ABNORMAL HIGH (ref 34.0–46.6)
Hemoglobin: 15 g/dL (ref 11.1–15.9)
Immature Grans (Abs): 0 x10E3/uL (ref 0.0–0.1)
Immature Granulocytes: 0 %
Lymphocytes Absolute: 2.2 x10E3/uL (ref 0.7–3.1)
Lymphs: 31 %
MCH: 28.8 pg (ref 26.6–33.0)
MCHC: 32 g/dL (ref 31.5–35.7)
MCV: 90 fL (ref 79–97)
Monocytes Absolute: 0.6 x10E3/uL (ref 0.1–0.9)
Monocytes: 8 %
Neutrophils Absolute: 3.8 x10E3/uL (ref 1.4–7.0)
Neutrophils: 56 %
Platelets: 298 x10E3/uL (ref 150–450)
RBC: 5.21 x10E6/uL (ref 3.77–5.28)
RDW: 12.8 % (ref 11.7–15.4)
WBC: 6.9 x10E3/uL (ref 3.4–10.8)

## 2023-10-02 LAB — LIPID PANEL
Chol/HDL Ratio: 3.6 ratio (ref 0.0–4.4)
Cholesterol, Total: 237 mg/dL — ABNORMAL HIGH (ref 100–199)
HDL: 65 mg/dL (ref >39)
LDL Chol Calc (NIH): 149 mg/dL — ABNORMAL HIGH (ref 0–99)
Triglycerides: 132 mg/dL (ref 0–149)
VLDL Cholesterol Cal: 23 mg/dL (ref 5–40)

## 2023-10-03 ENCOUNTER — Ambulatory Visit: Payer: Self-pay | Admitting: Physician Assistant

## 2023-10-04 ENCOUNTER — Encounter (INDEPENDENT_AMBULATORY_CARE_PROVIDER_SITE_OTHER): Payer: Self-pay | Admitting: *Deleted

## 2023-10-07 DIAGNOSIS — Z1211 Encounter for screening for malignant neoplasm of colon: Secondary | ICD-10-CM | POA: Diagnosis not present

## 2023-10-08 DIAGNOSIS — I87391 Chronic venous hypertension (idiopathic) with other complications of right lower extremity: Secondary | ICD-10-CM | POA: Diagnosis not present

## 2023-10-08 DIAGNOSIS — I83891 Varicose veins of right lower extremities with other complications: Secondary | ICD-10-CM | POA: Diagnosis not present

## 2023-10-09 DIAGNOSIS — I83812 Varicose veins of left lower extremities with pain: Secondary | ICD-10-CM | POA: Diagnosis not present

## 2023-10-09 DIAGNOSIS — I83892 Varicose veins of left lower extremities with other complications: Secondary | ICD-10-CM | POA: Diagnosis not present

## 2023-10-12 LAB — COLOGUARD: COLOGUARD: NEGATIVE

## 2023-10-22 DIAGNOSIS — I83891 Varicose veins of right lower extremities with other complications: Secondary | ICD-10-CM | POA: Diagnosis not present

## 2023-10-22 DIAGNOSIS — I87391 Chronic venous hypertension (idiopathic) with other complications of right lower extremity: Secondary | ICD-10-CM | POA: Diagnosis not present
